# Patient Record
Sex: Female | Born: 1970 | Race: Black or African American | Hispanic: No | Marital: Single | Smoking: Never smoker
Health system: Southern US, Community
[De-identification: ages and names within clinical notes are randomized; demographics above are authoritative.]

## PROBLEM LIST (undated history)

## (undated) DIAGNOSIS — D219 Benign neoplasm of connective and other soft tissue, unspecified: Secondary | ICD-10-CM

## (undated) DIAGNOSIS — D649 Anemia, unspecified: Secondary | ICD-10-CM

## (undated) HISTORY — DX: Anemia, unspecified: D64.9

## (undated) HISTORY — PX: ABDOMINAL HYSTERECTOMY: SHX81

## (undated) HISTORY — PX: FOOT SURGERY: SHX648

## (undated) HISTORY — DX: Benign neoplasm of connective and other soft tissue, unspecified: D21.9

## (undated) HISTORY — PX: TUBAL LIGATION: SHX77

---

## 2013-04-28 ENCOUNTER — Ambulatory Visit: Payer: Self-pay | Admitting: Family Medicine

## 2014-12-30 ENCOUNTER — Encounter: Payer: Self-pay | Admitting: Family Medicine

## 2017-01-07 ENCOUNTER — Other Ambulatory Visit: Payer: Self-pay | Admitting: Medical Oncology

## 2017-01-07 DIAGNOSIS — Z1231 Encounter for screening mammogram for malignant neoplasm of breast: Secondary | ICD-10-CM

## 2017-02-04 ENCOUNTER — Encounter: Payer: Self-pay | Admitting: Radiology

## 2017-02-04 ENCOUNTER — Ambulatory Visit
Admission: RE | Admit: 2017-02-04 | Discharge: 2017-02-04 | Disposition: A | Discharge status: Home / Self Care | Payer: 59 | Source: Ambulatory Visit | Attending: Medical Oncology | Admitting: Medical Oncology

## 2017-02-04 DIAGNOSIS — Z1231 Encounter for screening mammogram for malignant neoplasm of breast: Secondary | ICD-10-CM | POA: Insufficient documentation

## 2018-01-08 ENCOUNTER — Other Ambulatory Visit: Payer: Self-pay | Admitting: Family Medicine

## 2018-01-08 DIAGNOSIS — Z1231 Encounter for screening mammogram for malignant neoplasm of breast: Secondary | ICD-10-CM

## 2018-02-05 ENCOUNTER — Ambulatory Visit
Admission: RE | Admit: 2018-02-05 | Discharge: 2018-02-05 | Disposition: A | Discharge status: Home / Self Care | Payer: 59 | Source: Ambulatory Visit | Attending: Family Medicine | Admitting: Family Medicine

## 2018-02-05 DIAGNOSIS — Z1231 Encounter for screening mammogram for malignant neoplasm of breast: Secondary | ICD-10-CM | POA: Diagnosis not present

## 2018-08-28 DIAGNOSIS — Z6841 Body Mass Index (BMI) 40.0 and over, adult: Secondary | ICD-10-CM | POA: Insufficient documentation

## 2018-09-28 ENCOUNTER — Other Ambulatory Visit: Payer: Self-pay | Admitting: Oncology

## 2018-09-28 DIAGNOSIS — D509 Iron deficiency anemia, unspecified: Secondary | ICD-10-CM | POA: Insufficient documentation

## 2018-09-29 ENCOUNTER — Other Ambulatory Visit: Payer: Self-pay

## 2018-09-30 ENCOUNTER — Inpatient Hospital Stay: Payer: 59 | Attending: Oncology | Admitting: Oncology

## 2018-09-30 ENCOUNTER — Encounter: Payer: Self-pay | Admitting: Oncology

## 2018-09-30 ENCOUNTER — Other Ambulatory Visit: Payer: Self-pay

## 2018-09-30 VITALS — BP 130/91 | HR 94 | Temp 95.9°F | Resp 18 | Ht 64.0 in | Wt 231.1 lb

## 2018-09-30 DIAGNOSIS — D509 Iron deficiency anemia, unspecified: Secondary | ICD-10-CM

## 2018-09-30 DIAGNOSIS — D219 Benign neoplasm of connective and other soft tissue, unspecified: Secondary | ICD-10-CM | POA: Diagnosis not present

## 2018-09-30 NOTE — Progress Notes (Signed)
Pt has been anemic all her life- only took iron pills and she would only take them 2 days before she would get nauseated and would stop taking them. She feels fine she states. She has good energy and sleeps well every night

## 2018-09-30 NOTE — Progress Notes (Signed)
Hematology/Oncology Consult note [facility] Telephone:([phone] [phone]  Patient Care Team: Minna Antis as PCP - General (Physician Assistant)   Name of the patient: Tracey Landry  [id number]  [birth year]    Reason for referral-iron deficiency anemia   Referring physician-Dr. Daryll Drown  Date of visit: 09/30/18   History of presenting illness- Patient is a 48 year old female who sees [name] for pelvic pain and fibroids.  She does have menorrhagia secondary to fibroids and will be undergoing surgery for fibroids in 2 to 3 weeks time.  She has been referred to [place] for iron deficiency anemia.  Most recent CBC from 09/25/2018. Showed white count of 4.9, H&H of 9.9/32.1 with an MCV of 76.4 iron study showed a low ferritin of 4 and increased TIBC of 498.  CMP was within normal limits.  Patient reports iron deficiency anemia for most of her life.  She has been taking oral iron tablets which have not been helping her and it also causes abdominal discomfort and therefore she stopped taking it.  She does have significant fibroids and will be undergoing hysterectomy in 3 weeks.  However she states that these fibroids mainly cause pain and pelvic discomfort but they have not caused menorrhagia.  Her cycles are regular and they last for about 3 to 4 days.  Denies any family history of colon cancer.  Denies any unintentional weight loss.  Denies any blood in stool or urine.  Denies any dark melanotic stool.  Denies any consistent use of NSAIDs.  ECOG PS- 0  Pain scale- 0   Review of systems- Review of Systems  Constitutional: Negative for chills, fever, malaise/fatigue and weight loss.  HENT: Negative for congestion, ear discharge and nosebleeds.   Eyes: Negative for blurred vision.  Respiratory: Negative for cough, hemoptysis, sputum production, shortness of breath and wheezing.   Cardiovascular: Negative for chest pain, palpitations,  orthopnea and claudication.  Gastrointestinal: Negative for abdominal pain, blood in stool, constipation, diarrhea, heartburn, melena, nausea and vomiting.  Genitourinary: Negative for dysuria, flank pain, frequency, hematuria and urgency.  Musculoskeletal: Negative for back pain, joint pain and myalgias.  Skin: Negative for rash.  Neurological: Negative for dizziness, tingling, focal weakness, seizures, weakness and headaches.  Endo/Heme/Allergies: Does not bruise/bleed easily.  Psychiatric/Behavioral: Negative for depression and suicidal ideas. The patient does not have insomnia.     No Known Allergies  Patient Active Problem List   Diagnosis Date Noted  . Iron deficiency anemia 09/28/2018     Past Medical History:  Diagnosis Date  . Anemia   . Leiomyoma      Past surgical history: Tubal ligation and foot surgery  Social History   Socioeconomic History  . Marital status: Single    Spouse name: Not on file  . Number of children: Not on file  . Years of education: Not on file  . Highest education level: Not on file  Occupational History  . Not on file  Social Needs  . Financial resource strain: Not on file  . Food insecurity    Worry: Not on file    Inability: Not on file  . Transportation needs    Medical: Not on file    Non-medical: Not on file  Tobacco Use  . Smoking status: Never Smoker  . Smokeless tobacco: Never Used  Substance and Sexual Activity  . Alcohol use: Never    Frequency: Never  . Drug use: Never  . Sexual activity: Yes  Lifestyle  .  Physical activity    Days per week: Not on file    Minutes per session: Not on file  . Stress: Not on file  Relationships  . Social Herbalist on phone: Not on file    Gets together: Not on file    Attends religious service: Not on file    Active member of club or organization: Not on file    Attends meetings of clubs or organizations: Not on file    Relationship status: Not on file  . Intimate  partner violence    Fear of current or ex partner: Not on file    Emotionally abused: Not on file    Physically abused: Not on file    Forced sexual activity: Not on file  Other Topics Concern  . Not on file  Social History Narrative  . Not on file     Family History  Problem Relation Age of Onset  . Breast cancer Neg Hx     No current outpatient medications on file.   Physical exam:  Vitals:   09/30/18 0916  BP: (!) 130/91  Pulse: 94  Resp: 18  Temp: (!) 95.9 F (35.5 C)  TempSrc: Tympanic  Weight: 231 lb 1.6 oz (104.8 kg)  Height: 5\' 4"  (1.626 m)   Physical Exam HENT:     Head: Normocephalic and atraumatic.  Eyes:     Pupils: Pupils are equal, round, and reactive to light.  Neck:     Musculoskeletal: Normal range of motion.  Cardiovascular:     Rate and Rhythm: Normal rate and regular rhythm.     Heart sounds: Normal heart sounds.  Pulmonary:     Effort: Pulmonary effort is normal.     Breath sounds: Normal breath sounds.  Abdominal:     General: Bowel sounds are normal.     Palpations: Abdomen is soft.     Comments: Palpable intra-abdominal masses consistent with fibroids  Skin:    General: Skin is warm and dry.  Neurological:     Mental Status: She is alert and oriented to person, place, and time.        Assessment and plan- Patient is a 48 y.o. female referred for iron deficiency anemia   Although patient has massive fibroids and will be undergoing a hysterectomy in 3 weeks, patient does not report any menorrhagia secondary to her fibroids.  It is therefore unclear if her iron deficiency anemia is to be due to her fibroids.  II will refer her to gastroenterology for further work-up.  Recent labs are indicative of iron deficiency anemia.  She has upcoming surgery 3 weeks time.  I will therefore proceed with Feraheme 510 mg IV weekly x2 doses.  Discussed risks and benefits of Feraheme including all but not limited to headache, neck swelling and possible  risk of infusion reaction.  Patient understands and agrees to proceed as planned.  I will see her back in 2 months time with repeat CBC ferritin and iron studies, B12, folate, TSH and reticulocyte count.  I will check urinalysis today   Thank you for this kind referral and the opportunity to participate in the care of this patient   Visit Diagnosis 1. Iron deficiency anemia, unspecified iron deficiency anemia type     Dr. Randa Evens, MD, MPH [facility] [phone] 09/30/2018  9:56 AM

## 2018-10-01 ENCOUNTER — Other Ambulatory Visit: Payer: Self-pay

## 2018-10-02 ENCOUNTER — Other Ambulatory Visit: Payer: Self-pay

## 2018-10-02 ENCOUNTER — Inpatient Hospital Stay: Payer: 59

## 2018-10-02 VITALS — BP 123/77 | HR 88 | Temp 96.1°F | Resp 18

## 2018-10-02 DIAGNOSIS — D509 Iron deficiency anemia, unspecified: Secondary | ICD-10-CM | POA: Diagnosis not present

## 2018-10-02 LAB — URINALYSIS, COMPLETE (UACMP) WITH MICROSCOPIC
Bacteria, UA: NONE SEEN
Bilirubin Urine: NEGATIVE
Glucose, UA: NEGATIVE mg/dL
Hgb urine dipstick: NEGATIVE
Ketones, ur: NEGATIVE mg/dL
Leukocytes,Ua: NEGATIVE
Nitrite: NEGATIVE
Protein, ur: NEGATIVE mg/dL
Specific Gravity, Urine: 1.008 (ref 1.005–1.030)
WBC, UA: NONE SEEN WBC/hpf (ref 0–5)
pH: 7 (ref 5.0–8.0)

## 2018-10-02 MED ORDER — SODIUM CHLORIDE 0.9 % IV SOLN
510.0000 mg | Freq: Once | INTRAVENOUS | Status: AC
  Administered 2018-10-02: 510 mg via INTRAVENOUS
  Filled 2018-10-02: qty 17

## 2018-10-02 MED ORDER — SODIUM CHLORIDE 0.9 % IV SOLN
Freq: Once | INTRAVENOUS | Status: AC
  Administered 2018-10-02: 14:00:00 via INTRAVENOUS
  Filled 2018-10-02: qty 250

## 2018-10-02 NOTE — Progress Notes (Signed)
Pt tolerated infusion well. Pt and VS stable at discharge.  

## 2018-10-07 ENCOUNTER — Encounter: Payer: Self-pay | Admitting: *Deleted

## 2018-10-08 ENCOUNTER — Other Ambulatory Visit: Payer: Self-pay

## 2018-10-08 ENCOUNTER — Inpatient Hospital Stay: Payer: 59 | Attending: Oncology

## 2018-10-08 VITALS — BP 149/87 | HR 100 | Resp 20

## 2018-10-08 DIAGNOSIS — D509 Iron deficiency anemia, unspecified: Secondary | ICD-10-CM | POA: Insufficient documentation

## 2018-10-08 MED ORDER — SODIUM CHLORIDE 0.9 % IV SOLN
510.0000 mg | Freq: Once | INTRAVENOUS | Status: AC
  Administered 2018-10-08: 510 mg via INTRAVENOUS
  Filled 2018-10-08: qty 17

## 2018-10-08 MED ORDER — SODIUM CHLORIDE 0.9 % IV SOLN
Freq: Once | INTRAVENOUS | Status: AC
  Administered 2018-10-08: 14:00:00 via INTRAVENOUS
  Filled 2018-10-08: qty 250

## 2018-10-14 ENCOUNTER — Encounter
Admission: RE | Admit: 2018-10-14 | Discharge: 2018-10-14 | Disposition: A | Discharge status: Home / Self Care | Payer: 59 | Source: Ambulatory Visit | Attending: Obstetrics & Gynecology | Admitting: Obstetrics & Gynecology

## 2018-10-14 ENCOUNTER — Other Ambulatory Visit: Payer: Self-pay

## 2018-10-14 NOTE — Patient Instructions (Signed)
Your procedure is scheduled on: 10/19/18 Report to Day Surgery.[facility] SECOND FLOOR To find out your arrival time please call [phone] between 1PM - 3PM on 10/16/18.  Remember: Instructions that are not followed completely may result in serious medical risk,  up to and including death, or upon the discretion of your surgeon and anesthesiologist your  surgery may need to be rescheduled.     _X__ 1. Do not eat food after midnight the night before your procedure.                 No gum chewing or hard candies. You may drink clear liquids up to 2 hours                 before you are scheduled to arrive for your surgery- DO not drink clear                 liquids within 2 hours of the start of your surgery.                 Clear Liquids include:  water, apple juice without pulp, clear carbohydrate                 drink such as Clearfast of Gatorade, Black Coffee or Tea (Do not add                 anything to coffee or tea). FINISH CARB DRINK 3 FULL HOURS BEFORE ARRIVAL AT HOSPITAL AM OF SURGERY  __X__2.  On the morning of surgery brush your teeth with toothpaste and water, you                may rinse your mouth with mouthwash if you wish.  Do not swallow any toothpaste of mouthwash.     _X__ 3.  No Alcohol for 24 hours before or after surgery.   _X__ 4.  Do Not Smoke or use e-cigarettes For 24 Hours Prior to Your Surgery.                 Do not use any chewable tobacco products for at least 6 hours prior to                 surgery.  ____  5.  Bring all medications with you on the day of surgery if instructed.   _X___  6.  Notify your doctor if there is any change in your medical condition      (cold, fever, infections).     Do not wear jewelry, make-up, hairpins, clips or nail polish. Do not wear lotions, powders, or perfumes. You may wear deodorant. Do not shave 48 hours prior to surgery. Men may shave face and neck. Do not bring valuables to the  hospital.    [facility] is not responsible for any belongings or valuables.  Contacts, dentures or bridgework may not be worn into surgery. Leave your suitcase in the car. After surgery it may be brought to your room. For patients admitted to the hospital, discharge time is determined by your treatment team.   Patients discharged the day of surgery will not be allowed to drive home.   Please read over the following fact sheets that you were given:   Surgical Site Infection Prevention          ____ Take these medicines the morning of surgery with A SIP OF WATER:    1. NONE  2.   3.   4.  5.  6.  ____ Fleet Enema (as directed)   _X___ Use CHG Soap as directed  ____ Use inhalers on the day of surgery  ____ Stop metformin 2 days prior to surgery    ____ Take 1/2 of usual insulin dose the night before surgery. No insulin the morning          of surgery.   ____ Stop Coumadin/Plavix/aspirin on   X____ Stop Anti-inflammatories on  DO NOT USE OR START ANY UNTIL AFTER SURGERY   _X___ Stop supplements until after surgery.  DO NOT START ANY UNTIL AFTER SURGERY  ____ Bring C-Pap to the hospital.

## 2018-10-15 ENCOUNTER — Other Ambulatory Visit
Admission: RE | Admit: 2018-10-15 | Discharge: 2018-10-15 | Disposition: A | Discharge status: Home / Self Care | Payer: 59 | Source: Ambulatory Visit | Attending: Obstetrics & Gynecology | Admitting: Obstetrics & Gynecology

## 2018-10-15 DIAGNOSIS — Z01812 Encounter for preprocedural laboratory examination: Secondary | ICD-10-CM | POA: Insufficient documentation

## 2018-10-15 DIAGNOSIS — Z1159 Encounter for screening for other viral diseases: Secondary | ICD-10-CM | POA: Diagnosis not present

## 2018-10-15 LAB — CBC
HCT: 35.7 % — ABNORMAL LOW (ref 36.0–46.0)
Hemoglobin: 11.1 g/dL — ABNORMAL LOW (ref 12.0–15.0)
MCH: 24.8 pg — ABNORMAL LOW (ref 26.0–34.0)
MCHC: 31.1 g/dL (ref 30.0–36.0)
MCV: 79.9 fL — ABNORMAL LOW (ref 80.0–100.0)
Platelets: 304 10*3/uL (ref 150–400)
RBC: 4.47 MIL/uL (ref 3.87–5.11)
RDW: 23.6 % — ABNORMAL HIGH (ref 11.5–15.5)
WBC: 5.2 10*3/uL (ref 4.0–10.5)
nRBC: 0 % (ref 0.0–0.2)

## 2018-10-15 LAB — BASIC METABOLIC PANEL
Anion gap: 8 (ref 5–15)
BUN: 12 mg/dL (ref 6–20)
CO2: 24 mmol/L (ref 22–32)
Calcium: 9.6 mg/dL (ref 8.9–10.3)
Chloride: 105 mmol/L (ref 98–111)
Creatinine, Ser: 0.57 mg/dL (ref 0.44–1.00)
GFR calc Af Amer: >60 mL/min (ref >60)
GFR calc non Af Amer: >60 mL/min (ref >60)
Glucose, Bld: 100 mg/dL — ABNORMAL HIGH (ref 70–99)
Potassium: 4.3 mmol/L (ref 3.5–5.1)
Sodium: 137 mmol/L (ref 135–145)

## 2018-10-15 LAB — TYPE AND SCREEN
ABO/RH(D): O POS
Antibody Screen: NEGATIVE

## 2018-10-16 LAB — SARS CORONAVIRUS 2 (TAT 6-24 HRS): SARS Coronavirus 2: NEGATIVE

## 2018-10-18 MED ORDER — DEXTROSE 5 % IV SOLN
3.0000 g | INTRAVENOUS | Status: AC
  Administered 2018-10-19 (×2): 3 g via INTRAVENOUS
  Filled 2018-10-18: qty 3

## 2018-10-19 ENCOUNTER — Ambulatory Visit: Payer: 59

## 2018-10-19 ENCOUNTER — Other Ambulatory Visit: Payer: Self-pay

## 2018-10-19 ENCOUNTER — Ambulatory Visit: Payer: 59 | Admitting: Certified Registered"

## 2018-10-19 ENCOUNTER — Ambulatory Visit
Admission: RE | Admit: 2018-10-19 | Discharge: 2018-10-19 | Disposition: A | Discharge status: Home / Self Care | Payer: 59 | Attending: Obstetrics & Gynecology | Admitting: Obstetrics & Gynecology

## 2018-10-19 ENCOUNTER — Encounter: Payer: Self-pay | Admitting: *Deleted

## 2018-10-19 ENCOUNTER — Encounter
Admission: RE | Disposition: A | Discharge status: Home / Self Care | Payer: Self-pay | Source: Home / Self Care | Attending: Obstetrics & Gynecology

## 2018-10-19 DIAGNOSIS — Z09 Encounter for follow-up examination after completed treatment for conditions other than malignant neoplasm: Secondary | ICD-10-CM

## 2018-10-19 DIAGNOSIS — N134 Hydroureter: Secondary | ICD-10-CM | POA: Diagnosis not present

## 2018-10-19 DIAGNOSIS — N1330 Unspecified hydronephrosis: Secondary | ICD-10-CM | POA: Diagnosis not present

## 2018-10-19 DIAGNOSIS — D259 Leiomyoma of uterus, unspecified: Secondary | ICD-10-CM | POA: Insufficient documentation

## 2018-10-19 DIAGNOSIS — N800 Endometriosis of uterus: Secondary | ICD-10-CM | POA: Diagnosis not present

## 2018-10-19 HISTORY — PX: ROBOTIC ASSISTED TOTAL HYSTERECTOMY WITH SALPINGECTOMY: SHX6679

## 2018-10-19 HISTORY — PX: ROBOTIC ASSISTED LAPAROSCOPIC LYSIS OF ADHESION: SHX6080

## 2018-10-19 HISTORY — PX: CYSTOSCOPY W/ URETERAL STENT PLACEMENT: SHX1429

## 2018-10-19 LAB — ABO/RH: ABO/RH(D): O POS

## 2018-10-19 LAB — POCT PREGNANCY, URINE: Preg Test, Ur: NEGATIVE

## 2018-10-19 SURGERY — ROBOTIC ASSISTED TOTAL HYSTERECTOMY WITH SALPINGECTOMY
Anesthesia: General | Laterality: Bilateral

## 2018-10-19 MED ORDER — IBUPROFEN 800 MG PO TABS: 800.0000 mg | ORAL_TABLET | Freq: Four times a day (QID) | ORAL | 1 refills | Status: DC

## 2018-10-19 MED ORDER — GABAPENTIN 300 MG PO CAPS
600.0000 mg | ORAL_CAPSULE | ORAL | Status: AC
  Administered 2018-10-19: 600 mg via ORAL

## 2018-10-19 MED ORDER — FAMOTIDINE 20 MG PO TABS
20.0000 mg | ORAL_TABLET | Freq: Once | ORAL | Status: AC
  Administered 2018-10-19: 20 mg via ORAL

## 2018-10-19 MED ORDER — ONDANSETRON HCL 4 MG/2ML IJ SOLN
INTRAMUSCULAR | Status: DC | PRN
  Administered 2018-10-19: 4 mg via INTRAVENOUS

## 2018-10-19 MED ORDER — DEXAMETHASONE SODIUM PHOSPHATE 10 MG/ML IJ SOLN
INTRAMUSCULAR | Status: AC
  Filled 2018-10-19: qty 1

## 2018-10-19 MED ORDER — FENTANYL CITRATE (PF) 100 MCG/2ML IJ SOLN
INTRAMUSCULAR | Status: DC | PRN
  Administered 2018-10-19 (×3): 50 ug via INTRAVENOUS
  Administered 2018-10-19: 100 ug via INTRAVENOUS

## 2018-10-19 MED ORDER — ROCURONIUM BROMIDE 100 MG/10ML IV SOLN
INTRAVENOUS | Status: DC | PRN
  Administered 2018-10-19: 20 mg via INTRAVENOUS
  Administered 2018-10-19: 50 mg via INTRAVENOUS
  Administered 2018-10-19: 10 mg via INTRAVENOUS
  Administered 2018-10-19 (×2): 20 mg via INTRAVENOUS
  Administered 2018-10-19: 30 mg via INTRAVENOUS
  Administered 2018-10-19: 20 mg via INTRAVENOUS
  Administered 2018-10-19: 30 mg via INTRAVENOUS
  Administered 2018-10-19 (×2): 10 mg via INTRAVENOUS

## 2018-10-19 MED ORDER — LIDOCAINE HCL (CARDIAC) PF 100 MG/5ML IV SOSY
PREFILLED_SYRINGE | INTRAVENOUS | Status: DC | PRN
  Administered 2018-10-19: 100 mg via INTRAVENOUS

## 2018-10-19 MED ORDER — LACTATED RINGERS IV SOLN
INTRAVENOUS | Status: DC
  Administered 2018-10-19: 06:00:00 via INTRAVENOUS

## 2018-10-19 MED ORDER — ACETAMINOPHEN 650 MG RE SUPP
650.0000 mg | RECTAL | Status: DC | PRN
  Filled 2018-10-19: qty 1

## 2018-10-19 MED ORDER — KETOROLAC TROMETHAMINE 30 MG/ML IJ SOLN
30.0000 mg | Freq: Four times a day (QID) | INTRAMUSCULAR | Status: DC
  Filled 2018-10-19: qty 1

## 2018-10-19 MED ORDER — FENTANYL CITRATE (PF) 250 MCG/5ML IJ SOLN
INTRAMUSCULAR | Status: AC
  Filled 2018-10-19: qty 5

## 2018-10-19 MED ORDER — MEPERIDINE HCL 50 MG/ML IJ SOLN: 6.2500 mg | INTRAMUSCULAR | Status: DC | PRN

## 2018-10-19 MED ORDER — EPHEDRINE SULFATE 50 MG/ML IJ SOLN
INTRAMUSCULAR | Status: AC
  Filled 2018-10-19: qty 1

## 2018-10-19 MED ORDER — ACETAMINOPHEN 325 MG PO TABS: 650.0000 mg | ORAL_TABLET | ORAL | Status: DC | PRN

## 2018-10-19 MED ORDER — PROMETHAZINE HCL 25 MG/ML IJ SOLN: 6.2500 mg | INTRAMUSCULAR | Status: DC | PRN

## 2018-10-19 MED ORDER — BUPIVACAINE LIPOSOME 1.3 % IJ SUSP
INTRAMUSCULAR | Status: AC
  Filled 2018-10-19: qty 20

## 2018-10-19 MED ORDER — SODIUM CHLORIDE FLUSH 0.9 % IV SOLN
INTRAVENOUS | Status: AC
  Filled 2018-10-19: qty 10

## 2018-10-19 MED ORDER — FAMOTIDINE 20 MG PO TABS
ORAL_TABLET | ORAL | Status: AC
  Administered 2018-10-19: 20 mg via ORAL
  Filled 2018-10-19: qty 1

## 2018-10-19 MED ORDER — OXYCODONE HCL 5 MG PO TABS
5.0000 mg | ORAL_TABLET | Freq: Once | ORAL | Status: AC
  Administered 2018-10-19: 5 mg via ORAL

## 2018-10-19 MED ORDER — DEXAMETHASONE SODIUM PHOSPHATE 10 MG/ML IJ SOLN
4.0000 mg | INTRAMUSCULAR | Status: AC
  Administered 2018-10-19: 4 mg via INTRAVENOUS

## 2018-10-19 MED ORDER — KETOROLAC TROMETHAMINE 30 MG/ML IJ SOLN
INTRAMUSCULAR | Status: DC | PRN
  Administered 2018-10-19: 30 mg via INTRAVENOUS

## 2018-10-19 MED ORDER — HEPARIN SODIUM (PORCINE) 5000 UNIT/ML IJ SOLN
5000.0000 [IU] | INTRAMUSCULAR | Status: AC
  Administered 2018-10-19: 5000 [IU] via SUBCUTANEOUS

## 2018-10-19 MED ORDER — MIDAZOLAM HCL 2 MG/2ML IJ SOLN
INTRAMUSCULAR | Status: DC | PRN
  Administered 2018-10-19: 2 mg via INTRAVENOUS

## 2018-10-19 MED ORDER — LABETALOL HCL 5 MG/ML IV SOLN
INTRAVENOUS | Status: DC | PRN
  Administered 2018-10-19: 5 mg via INTRAVENOUS

## 2018-10-19 MED ORDER — LABETALOL HCL 5 MG/ML IV SOLN
INTRAVENOUS | Status: AC
  Filled 2018-10-19: qty 4

## 2018-10-19 MED ORDER — DEXAMETHASONE SODIUM PHOSPHATE 10 MG/ML IJ SOLN
INTRAMUSCULAR | Status: DC | PRN
  Administered 2018-10-19: 5 mg via INTRAVENOUS

## 2018-10-19 MED ORDER — PHENYLEPHRINE HCL (PRESSORS) 10 MG/ML IV SOLN
INTRAVENOUS | Status: DC | PRN
  Administered 2018-10-19 (×4): 100 ug via INTRAVENOUS

## 2018-10-19 MED ORDER — ONDANSETRON HCL 4 MG/2ML IJ SOLN
INTRAMUSCULAR | Status: AC
  Filled 2018-10-19: qty 2

## 2018-10-19 MED ORDER — PROPOFOL 10 MG/ML IV BOLUS
INTRAVENOUS | Status: DC | PRN
  Administered 2018-10-19: 180 mg via INTRAVENOUS

## 2018-10-19 MED ORDER — BUPIVACAINE LIPOSOME 1.3 % IJ SUSP
INTRAMUSCULAR | Status: DC | PRN
  Administered 2018-10-19: 20 mL

## 2018-10-19 MED ORDER — SUCCINYLCHOLINE CHLORIDE 20 MG/ML IJ SOLN
INTRAMUSCULAR | Status: AC
  Filled 2018-10-19: qty 1

## 2018-10-19 MED ORDER — SCOPOLAMINE 1 MG/3DAYS TD PT72
1.0000 | MEDICATED_PATCH | TRANSDERMAL | Status: DC
  Administered 2018-10-19: 1.5 mg via TRANSDERMAL

## 2018-10-19 MED ORDER — BUPIVACAINE HCL (PF) 0.5 % IJ SOLN
INTRAMUSCULAR | Status: AC
  Filled 2018-10-19: qty 30

## 2018-10-19 MED ORDER — PHENYLEPHRINE HCL (PRESSORS) 10 MG/ML IV SOLN
INTRAVENOUS | Status: AC
  Filled 2018-10-19: qty 1

## 2018-10-19 MED ORDER — DEXAMETHASONE SODIUM PHOSPHATE 10 MG/ML IJ SOLN
INTRAMUSCULAR | Status: AC
  Administered 2018-10-19: 4 mg via INTRAVENOUS
  Filled 2018-10-19: qty 1

## 2018-10-19 MED ORDER — ACETAMINOPHEN 500 MG PO TABS
1000.0000 mg | ORAL_TABLET | ORAL | Status: AC
  Administered 2018-10-19: 1000 mg via ORAL

## 2018-10-19 MED ORDER — OXYCODONE HCL 5 MG/5ML PO SOLN: 5.0000 mg | Freq: Once | ORAL | Status: DC | PRN

## 2018-10-19 MED ORDER — OXYBUTYNIN CHLORIDE 5 MG PO TABS: 5.0000 mg | ORAL_TABLET | Freq: Three times a day (TID) | ORAL | 1 refills | Status: DC

## 2018-10-19 MED ORDER — PROPOFOL 10 MG/ML IV BOLUS
INTRAVENOUS | Status: AC
  Filled 2018-10-19: qty 40

## 2018-10-19 MED ORDER — OXYCODONE HCL 5 MG PO TABS
ORAL_TABLET | ORAL | Status: AC
  Filled 2018-10-19: qty 1

## 2018-10-19 MED ORDER — ROCURONIUM BROMIDE 50 MG/5ML IV SOLN
INTRAVENOUS | Status: AC
  Filled 2018-10-19: qty 1

## 2018-10-19 MED ORDER — LIDOCAINE HCL (PF) 2 % IJ SOLN
INTRAMUSCULAR | Status: AC
  Filled 2018-10-19: qty 10

## 2018-10-19 MED ORDER — MIDAZOLAM HCL 2 MG/2ML IJ SOLN
INTRAMUSCULAR | Status: AC
  Filled 2018-10-19: qty 2

## 2018-10-19 MED ORDER — ENSURE PRE-SURGERY PO LIQD
296.0000 mL | Freq: Once | ORAL | Status: DC
  Filled 2018-10-19: qty 296

## 2018-10-19 MED ORDER — CELECOXIB 200 MG PO CAPS
400.0000 mg | ORAL_CAPSULE | ORAL | Status: AC
  Administered 2018-10-19: 400 mg via ORAL

## 2018-10-19 MED ORDER — ACETAMINOPHEN 500 MG PO TABS
ORAL_TABLET | ORAL | Status: AC
  Administered 2018-10-19: 1000 mg via ORAL
  Filled 2018-10-19: qty 2

## 2018-10-19 MED ORDER — OXYCODONE HCL 5 MG PO TABS: 5.0000 mg | ORAL_TABLET | ORAL | 0 refills | Status: DC | PRN

## 2018-10-19 MED ORDER — BUPIVACAINE HCL (PF) 0.5 % IJ SOLN
INTRAMUSCULAR | Status: DC | PRN
  Administered 2018-10-19: 30 mL

## 2018-10-19 MED ORDER — LACTATED RINGERS IV SOLN: INTRAVENOUS | Status: DC

## 2018-10-19 MED ORDER — SUGAMMADEX SODIUM 200 MG/2ML IV SOLN
INTRAVENOUS | Status: AC
  Filled 2018-10-19: qty 2

## 2018-10-19 MED ORDER — HEPARIN SODIUM (PORCINE) 5000 UNIT/ML IJ SOLN
INTRAMUSCULAR | Status: AC
  Administered 2018-10-19: 5000 [IU] via SUBCUTANEOUS
  Filled 2018-10-19: qty 1

## 2018-10-19 MED ORDER — OXYCODONE HCL 5 MG PO TABS: 5.0000 mg | ORAL_TABLET | Freq: Once | ORAL | Status: DC | PRN

## 2018-10-19 MED ORDER — MORPHINE SULFATE (PF) 4 MG/ML IV SOLN: 1.0000 mg | INTRAVENOUS | Status: DC | PRN

## 2018-10-19 MED ORDER — GABAPENTIN 300 MG PO CAPS
ORAL_CAPSULE | ORAL | Status: AC
  Administered 2018-10-19: 600 mg via ORAL
  Filled 2018-10-19: qty 2

## 2018-10-19 MED ORDER — FENTANYL CITRATE (PF) 100 MCG/2ML IJ SOLN: 25.0000 ug | INTRAMUSCULAR | Status: DC | PRN

## 2018-10-19 MED ORDER — SCOPOLAMINE 1 MG/3DAYS TD PT72
MEDICATED_PATCH | TRANSDERMAL | Status: AC
  Administered 2018-10-19: 1.5 mg via TRANSDERMAL
  Filled 2018-10-19: qty 1

## 2018-10-19 MED ORDER — CELECOXIB 200 MG PO CAPS
ORAL_CAPSULE | ORAL | Status: AC
  Administered 2018-10-19: 400 mg via ORAL
  Filled 2018-10-19: qty 2

## 2018-10-19 MED ORDER — LACTATED RINGERS IV SOLN
INTRAVENOUS | Status: DC | PRN
  Administered 2018-10-19 (×3): via INTRAVENOUS

## 2018-10-19 SURGICAL SUPPLY — 87 items
ANCHOR TIS RET SYS 1550ML (BAG) ×4 IMPLANT
ANCHOR TIS RET SYS 235ML (MISCELLANEOUS) ×4 IMPLANT
BAG URINE DRAINAGE (UROLOGICAL SUPPLIES) ×4 IMPLANT
BLADE SURG SZ11 CARB STEEL (BLADE) ×12 IMPLANT
CANISTER SUCT 1200ML W/VALVE (MISCELLANEOUS) ×4 IMPLANT
CATH FOLEY 2WAY  5CC 16FR (CATHETERS) ×2
CATH URETL 5X70 OPEN END (CATHETERS) ×4 IMPLANT
CATH URTH 16FR FL 2W BLN LF (CATHETERS) ×2 IMPLANT
CHLORAPREP W/TINT 26 (MISCELLANEOUS) ×4 IMPLANT
CORD BIP STRL DISP 12FT (MISCELLANEOUS) ×4 IMPLANT
COVER TIP SHEARS 8 DVNC (MISCELLANEOUS) ×2 IMPLANT
COVER TIP SHEARS 8MM DA VINCI (MISCELLANEOUS) ×2
COVER WAND RF STERILE (DRAPES) ×4 IMPLANT
CRADLE LAMINECT ARM (MISCELLANEOUS) ×4 IMPLANT
DEFOGGER SCOPE WARMER CLEARIFY (MISCELLANEOUS) ×4 IMPLANT
DERMABOND ADVANCED (GAUZE/BANDAGES/DRESSINGS) ×2
DERMABOND ADVANCED .7 DNX12 (GAUZE/BANDAGES/DRESSINGS) ×2 IMPLANT
DRAPE ARM DVNC X/XI (DISPOSABLE) ×8 IMPLANT
DRAPE COLUMN DVNC XI (DISPOSABLE) ×2 IMPLANT
DRAPE DA VINCI XI ARM (DISPOSABLE) ×8
DRAPE DA VINCI XI COLUMN (DISPOSABLE) ×2
DRAPE LEGGINS SURG 28X43 STRL (DRAPES) ×4 IMPLANT
DRAPE SHEET LG 3/4 BI-LAMINATE (DRAPES) ×12 IMPLANT
DRAPE UNDER BUTTOCK W/FLU (DRAPES) ×4 IMPLANT
DRIVER NDL 23- D MEGA 49.7X1.4 (INSTRUMENTS) ×2 IMPLANT
DRVR NDL 23- D MEGA 49.7X1.4 (INSTRUMENTS) ×2
ELECT REM PT RETURN 9FT ADLT (ELECTROSURGICAL) ×4
ELECTRODE REM PT RTRN 9FT ADLT (ELECTROSURGICAL) ×2 IMPLANT
FILTER LAP SMOKE EVAC STRL (MISCELLANEOUS) IMPLANT
GAUZE 4X4 16PLY RFD (DISPOSABLE) ×4 IMPLANT
GLOVE SURG SYN 6.5 ES PF (GLOVE) ×32 IMPLANT
GOWN STRL REUS W/ TWL LRG LVL3 (GOWN DISPOSABLE) ×16 IMPLANT
GOWN STRL REUS W/TWL LRG LVL3 (GOWN DISPOSABLE) ×16
GRASPER SUT TROCAR 14GX15 (MISCELLANEOUS) ×8 IMPLANT
GUIDEWIRE STR DUAL SENSOR (WIRE) ×4 IMPLANT
IRRIGATION STRYKERFLOW (MISCELLANEOUS) ×2 IMPLANT
IRRIGATOR STRYKERFLOW (MISCELLANEOUS) ×4
IV NS 1000ML (IV SOLUTION) ×2
IV NS 1000ML BAXH (IV SOLUTION) ×2 IMPLANT
KIT PINK PAD W/HEAD ARE REST (MISCELLANEOUS) ×4
KIT PINK PAD W/HEAD ARM REST (MISCELLANEOUS) ×2 IMPLANT
KIT TURNOVER CYSTO (KITS) ×4 IMPLANT
L-HOOK LAP DISP 36CM (ELECTROSURGICAL) ×4
LABEL OR SOLS (LABEL) ×4 IMPLANT
LHOOK LAP DISP 36CM (ELECTROSURGICAL) ×2 IMPLANT
MANIPULATOR VCARE LG CRV RETR (MISCELLANEOUS) IMPLANT
MANIPULATOR VCARE SML CRV RETR (MISCELLANEOUS) IMPLANT
MANIPULATOR VCARE STD CRV RETR (MISCELLANEOUS) ×4 IMPLANT
NDL INSUFF 14G 150MM VS150000 (NEEDLE) ×4 IMPLANT
NDL SAFETY 22GX1.5 (NEEDLE) ×4 IMPLANT
NEEDLE HYPO 22GX1.5 SAFETY (NEEDLE) ×4 IMPLANT
NEEDLE VERESS 14GA 120MM (NEEDLE) IMPLANT
NS IRRIG 1000ML POUR BTL (IV SOLUTION) ×4 IMPLANT
OBTURATOR OPTICAL STANDARD 8MM (TROCAR) ×2
OBTURATOR OPTICAL STND 8 DVNC (TROCAR) ×2
OBTURATOR OPTICALSTD 8 DVNC (TROCAR) ×2 IMPLANT
PACK LAP CHOLECYSTECTOMY (MISCELLANEOUS) ×4 IMPLANT
PAD OB MATERNITY 4.3X12.25 (PERSONAL CARE ITEMS) ×4 IMPLANT
PAD PREP 24X41 OB/GYN DISP (PERSONAL CARE ITEMS) ×4 IMPLANT
PENCIL ELECTRO HAND CTR (MISCELLANEOUS) ×4 IMPLANT
SEAL CANN UNIV 5-8 DVNC XI (MISCELLANEOUS) ×2 IMPLANT
SEAL XI 5MM-8MM UNIVERSAL (MISCELLANEOUS) ×2
SEALER VESSEL DA VINCI XI (MISCELLANEOUS) ×2
SEALER VESSEL EXT DVNC XI (MISCELLANEOUS) ×2 IMPLANT
SET CYSTO W/LG BORE CLAMP LF (SET/KITS/TRAYS/PACK) IMPLANT
SOLUTION ELECTROLUBE (MISCELLANEOUS) ×4 IMPLANT
SPONGE LAP 18X18 RF (DISPOSABLE) ×4 IMPLANT
STENT URET 6FRX24 CONTOUR (STENTS) ×4 IMPLANT
SUT CUT NEEDLE DRIVER (INSTRUMENTS) ×2
SUT DVC VLOC 180 0 12IN GS21 (SUTURE) ×4
SUT ETHIBOND NAB CT1 #1 30IN (SUTURE) ×4 IMPLANT
SUT MNCRL 4-0 (SUTURE)
SUT MNCRL 4-0 27XMFL (SUTURE)
SUT VIC AB 0 CT1 36 (SUTURE) IMPLANT
SUT VIC AB 0 UR5 27 (SUTURE) IMPLANT
SUT VIC AB 1 CT1 36 (SUTURE) ×4 IMPLANT
SUT VIC AB 2-0 CT1 27 (SUTURE)
SUT VIC AB 2-0 CT1 TAPERPNT 27 (SUTURE) IMPLANT
SUT VIC AB 3-0 SH 27 (SUTURE) ×4
SUT VIC AB 3-0 SH 27X BRD (SUTURE) ×4 IMPLANT
SUT VICRYL 0 AB UR-6 (SUTURE) ×4 IMPLANT
SUTURE DVC VLC 180 0 12IN GS21 (SUTURE) ×2 IMPLANT
SUTURE MNCRL 4-0 27XMF (SUTURE) IMPLANT
SYR 10ML LL (SYRINGE) ×4 IMPLANT
TROCAR BALLN GELPORT 12X130M (ENDOMECHANICALS) ×4 IMPLANT
TROCAR ENDO BLADELESS 11MM (ENDOMECHANICALS) ×4 IMPLANT
TUBING EVAC SMOKE HEATED PNEUM (TUBING) ×4 IMPLANT

## 2018-10-19 NOTE — Anesthesia Preprocedure Evaluation (Signed)
Anesthesia Evaluation  Patient identified by MRN, date of birth, ID band Patient awake    Reviewed: Allergy & Precautions, NPO status , Patient's Chart, lab work & pertinent test results  History of Anesthesia Complications Negative for: history of anesthetic complications  Airway Mallampati: III       Dental no notable dental hx.    Pulmonary neg pulmonary ROS, neg sleep apnea, neg COPD,    breath sounds clear to auscultation- rhonchi (-) wheezing      Cardiovascular Exercise Tolerance: Good (-) hypertension(-) CAD and (-) Past MI  Rhythm:Regular Rate:Normal - Systolic murmurs and - Diastolic murmurs    Neuro/Psych negative neurological ROS  negative psych ROS   GI/Hepatic negative GI ROS, Neg liver ROS,   Endo/Other  negative endocrine ROSneg diabetes  Renal/GU negative Renal ROS     Musculoskeletal negative musculoskeletal ROS (+)   Abdominal (+) + obese,   Peds  Hematology  (+) anemia ,   Anesthesia Other Findings   Reproductive/Obstetrics                             Anesthesia Physical Anesthesia Plan  ASA: II  Anesthesia Plan: General   Post-op Pain Management:    Induction: Intravenous  PONV Risk Score and Plan: 2 and Ondansetron, Dexamethasone and Midazolam  Airway Management Planned: Oral ETT  Additional Equipment:   Intra-op Plan:   Post-operative Plan: Extubation in OR  Informed Consent: I have reviewed the patients History and Physical, chart, labs and discussed the procedure including the risks, benefits and alternatives for the proposed anesthesia with the patient or authorized representative who has indicated his/her understanding and acceptance.     Dental advisory given  Plan Discussed with: CRNA and Anesthesiologist  Anesthesia Plan Comments:         Anesthesia Quick Evaluation

## 2018-10-19 NOTE — Progress Notes (Signed)
Patient's son, Weber Cooks, called to check on patient. I spoke with her son and provided update as patient is in PACU at this time. He understands [name] will call him with a full update. Weber Cooks states he is appreciative of the information.

## 2018-10-19 NOTE — Op Note (Addendum)
10/19/2018  PATIENT:  Tracey Landry  48 y.o. female  PRE-OPERATIVE DIAGNOSIS:  fibroid uterus, pelvic pain  POST-OPERATIVE DIAGNOSIS:  Same, possible ureteral injury,   PROCEDURE:  Procedure(s): ROBOTIC ASSISTED LAPAROSCOPIC  HYSTERECTOMY WITH BILATERAL SALPINGECTOMY,with hand morcellation (Bilateral) CYSTOSCOPY WITH RETROGRADE PYELOGRAM RIGHT STENT PLACEMENT XI ROBOTIC ASSISTED LAPAROSCOPIC LYSIS OF ADHESION & RIGHT URETEROLYSIS  SURGEON:  Surgeon(s) and Role: Panel 1:    * Wai Minotti, Honor Loh, MD - Primary    * Benjaman Kindler, MD - Assisting Panel 2:    * Bernardo Heater, Ronda Fairly, MD - Primary  ANESTHESIA: GET  EBL:  Total I/O In: 2000 [I.V.:2000] Out: 300 [Blood:300]  DRAINS: foley to gravity, right JJ ureteral stent  SPECIMEN: Uterus, Cervix, bilateral tubes  DISPOSITION OF SPECIMEN:  To pathology  COUNTS: correct x2  COMPLICATIONS: none apparent  PATIENT DISPOSITION:  VS stable to PACU  FINDINGS: 1. Enlarged fibroid uterus with adhesions to right pelvic sidewall, posterior peritoneum, and wide and deep to the right.   2. Possible crush injury vs cautery injury to right ureter noted when right uterus was separated from pelvic side wall. 3. Normal appearing previously ligated tubes and normal appearing ovaries.  This was a 7-hour surgery, due to patient's markedly enlarged uterus, minimally invasive nature, and complex surgical field.     Indication for surgery: Patient had presented with complaints of pelvic pain and large fibroid uterus.  Various treatment options were discussed and patient requested a hysterectomy to resolve her symptoms. Due to the size of her uterus, a robotic approach was preferred.  Risks benefits and alternatives were reviewed and informed consent was obtained.   Procedure: The patient was brought to the OR and identified as Tracey Landry.  She was given general anesthesia via endotracheal route.  Nasogastric tube was placed.  She was then  positioned in the dorsal lithotomy position and prepped and draped in the usual sterile fashion.  A surgical time-out was called. A foley catheter was placed.  A speculum was placed in the vagina and the cervix was visualized, grasped with a single tooth tenaculum and the V-Care uterine manipulator was placed in and around the cervix.  After a change of gloves, the attention was turned to the abdomen.  A midline incision was made approximately 4cm above the umbilicus.  The subcutaneous tissues were dissected, the fascia was divided, the peritoneum entered, and a balloon trochar was inserted.  Pneumoperitoneum was created to 95mmHg.  Three Robotic trochars and one 105mm assist trochar were inserted atraumatically under visualization.  The patient was placed in steep trendelenburg, and the daVinci robot was docked and a monopolar scissors, bipolar fenestrated grasper, and vessel sealer were employed.  A brief survey of the upper abdomen was performed.  The attention was turned to the pelvis. The uterus took up most of the space. The fimbriated ends of the bilateral tubes were ligated and removed from the abdomen. The right side of the uterus was adherent to the right pelvic sidewall.  The right peritoneum was divided, and the uterus was isolated retroperitoneally from the sidewall.  The ureter was seen near the IP.  The right uteroovarian ligament and vessels were cauterized and transected.  The mesosalpinx of the right tube was ligated and divided, and the anterior and posterior broad ligaments were undermined and divided close to the uterine body.  It was at this time the concern for the uretuer and how lateral the uterus was shaped.  Ureterolysis was performed from the  pelvic brim to the bladder, and exposed a kink near the uterine artery.  The artery was cauterized distal to the ureter, and divided at the site of insertion into the uterus. The attention was turned to the left, which was not adherent, but due to  the shape of the fibroids, was difficult to navigate.  The mesosalpinx was sealed and divided; the utero=ovarian ligament was sealed and divided; and the broad ligament was hugged against the uterus and divided down to the cervix.  The bladder flap was created and the vesicouterine peritoneum was dissected off of the anterior LUS and cervix.  The left uterine artery was sealed and divided.  Bilateral ureters were again identified.  The cervix was transected and the uterus brought up into the abdomen.  There was suspicion that the endocervix and external cervical os were included in the specimen, so a figure of eight of 0-vicryl was placed after the instruments were changed to needle drivers.  The uterus was attempted to be placed in a bag, but was too large.  The robot was undocked after surgical sites were deemed hemostatic.  A 5cm suprapubic Pfannenstiel incision was made, and the uterus was grasped.  Using the ExCITE technique, the uterus, tubes, and fibroids were removed from the abdominal cavity.  The suprapubic incision was closed with 0-vicryl and then injected with exparel with marcaine.  The subcutaneous tissues were irrigated.  The fascia of the 72mm ports were both closed with the inlet-closure device.  The skin of all incision were closed with 4-0 monocryl and covered with surgical glue.  The procedure was then deemed complete.  Due to the concern for ureteral injury, Dr. Bernardo Heater was asked to place a stent in the right ureter.  Please see his op note for this portion of the procedure.    The sponge, needle, and instrument counts were correct x2.  The patient tolerated reversal of anesthesia, and was brought to the PACU in a stable condition.  I was present and performed this case in its entirety. Larey Days, MD Attending Obstetrician and Gynecologist [facility]

## 2018-10-19 NOTE — Anesthesia Post-op Follow-up Note (Signed)
Anesthesia QCDR form completed.        

## 2018-10-19 NOTE — H&P (Signed)
Preoperative History and Physical  Tracey Landry is a 48 y.o. here for surgical management of fibroid uterus.  No significant preoperative concerns.  Proposed surgery: robotic assisted laparoscopic hysterectomy, bilateral salpingectomy, with hand morcellation, possible open.  Past Medical History:  Diagnosis Date  . Anemia    IRON INFUSIONS  . Leiomyoma    Past Surgical History:  Procedure Laterality Date  . FOOT SURGERY    . TUBAL LIGATION     OB History  No obstetric history on file.  Patient denies any other pertinent gynecologic issues.   No current facility-administered medications on file prior to encounter.    No current outpatient medications on file prior to encounter.   No Known Allergies  Social History:   reports that she has never smoked. She has never used smokeless tobacco. She reports that she does not drink alcohol or use drugs.  Family History  Problem Relation Age of Onset  . Hypertension Mother   . Hypertension Brother   . Breast cancer Neg Hx     Review of Systems: Noncontributory  PHYSICAL EXAM: Blood pressure (!) 160/92, pulse (!) 102, temperature 97.8 F (36.6 C), temperature source Oral, resp. rate 20, height 5\' 4"  (1.626 m), weight 105.7 kg, last menstrual period 10/14/2018, SpO2 99 %. General appearance - alert, well appearing, and in no distress Chest - clear to auscultation, no wheezes, rales or rhonchi, symmetric air entry Heart - normal rate and regular rhythm Abdomen - soft, nontender, nondistended, no masses or organomegaly Pelvic - examination not indicated Extremities - peripheral pulses normal, no pedal edema, no clubbing or cyanosis  Labs: Results for orders placed or performed during the hospital encounter of 10/19/18 (from the past 336 hour(s))  Pregnancy, urine POC   Collection Time: 10/19/18  6:03 AM  Result Value Ref Range   Preg Test, Ur NEGATIVE NEGATIVE  ABO/Rh   Collection Time: 10/19/18  6:34 AM  Result Value  Ref Range   ABO/RH(D)      O POS Performed at [facility], [address] [place], [place] [address]   Results for orders placed or performed during the hospital encounter of 10/15/18 (from the past 336 hour(s))  SARS Coronavirus 2 (Performed in [facility] hospital lab)   Collection Time: 10/15/18 10:32 AM   Specimen: Nasal Swab  Result Value Ref Range   SARS Coronavirus 2 NEGATIVE NEGATIVE  Basic metabolic panel   Collection Time: 10/15/18 10:32 AM  Result Value Ref Range   Sodium 137 135 - 145 mmol/L   Potassium 4.3 3.5 - 5.1 mmol/L   Chloride 105 98 - 111 mmol/L   CO2 24 22 - 32 mmol/L   Glucose, Bld 100 (H) 70 - 99 mg/dL   BUN 12 6 - 20 mg/dL   Creatinine, Ser 0.57 0.44 - 1.00 mg/dL   Calcium 9.6 8.9 - 10.3 mg/dL   GFR calc non Af Amer >60 >60 mL/min   GFR calc Af Amer >60 >60 mL/min   Anion gap 8 5 - 15  CBC   Collection Time: 10/15/18 10:32 AM  Result Value Ref Range   WBC 5.2 4.0 - 10.5 K/uL   RBC 4.47 3.87 - 5.11 MIL/uL   Hemoglobin 11.1 (L) 12.0 - 15.0 g/dL   HCT 35.7 (L) 36.0 - 46.0 %   MCV 79.9 (L) 80.0 - 100.0 fL   MCH 24.8 (L) 26.0 - 34.0 pg   MCHC 31.1 30.0 - 36.0 g/dL   RDW 23.6 (H)  11.5 - 15.5 %   Platelets 304 150 - 400 K/uL   nRBC 0.0 0.0 - 0.2 %  Type and screen [place]   Collection Time: 10/15/18 10:32 AM  Result Value Ref Range   ABO/RH(D) O POS    Antibody Screen NEG    Sample Expiration 10/29/2018,2359    Extend sample reason      NO TRANSFUSIONS OR PREGNANCY IN THE PAST 3 MONTHS Performed at [facility], [address] [place], [place] [address]     Imaging Studies: No results found.  Assessment: Patient Active Problem List   Diagnosis Date Noted  . Iron deficiency anemia 09/28/2018    Plan: Patient will undergo surgical management with robotic assisted laparoscopic hysterectomy and bilateral salpingectomy with hand morcellation, possibly open.   The risks of surgery were discussed in  detail with the patient including but not limited to: bleeding which may require transfusion or reoperation; infection which may require antibiotics; injury to surrounding organs which may involve bowel, bladder, ureters ; need for additional procedures including laparoscopy or laparotomy; thromboembolic phenomenon, surgical site problems and other postoperative/anesthesia complications. Likelihood of success in alleviating the patient's condition was discussed. Routine postoperative instructions will be reviewed with the patient and her family in detail after surgery.  The patient concurred with the proposed plan, giving informed written consent for the surgery.  Patient has been NPO since last night she will remain NPO for procedure.  Anesthesia and OR aware.  Preoperative prophylactic antibiotics and SCDs ordered on call to the OR.  To OR when ready.  ----- Larey Days, MD, [place] Attending Obstetrician and Gynecologist [facility], Department of [facility]

## 2018-10-19 NOTE — Op Note (Signed)
Preoperative diagnosis:  1. Fibroid uterus  Postoperative diagnosis:  1. Fibroid uterus  Procedure:  1. Cystoscopy 2. Right retrograde pyelography with interpretation 3. Right ureteral stent placement 6FR/24 cm   Surgeon: Nicki Reaper C. Taijon Vink, M.D.  Anesthesia: General  Complications: None  Intraoperative findings:  Right retrograde pyelogram-moderate right hydronephrosis and hydroureter to the junction of the mid/distal ureter.  No extravasation present.  EBL: None  Specimens: None  Indication: Tracey Landry is a 48 y.o. who underwent robotic assisted laparoscopic hysterectomy with bilateral salpingectomy.  She was found to have right ureteral adhesions and underwent ureteral lysis.  Due to the extensive dissection in this area placement of a ureteral stent was requested by [name].  Description of procedure: After completion of her gynecologic procedure she was placed in the lithotomy position.  Her Foley catheter was removed.  A 21 French cystoscope was duplicated and passed per urethra.  Panendoscopy was performed and the bladder mucosa was normal in appearance without erythema, solid or papillary lesions.  The left ureteral orifice was normal appearing with clear efflux.  No efflux was seen from the right ureteral orifice.  A 0.038 sensor wire was placed through the cystoscope and in the right ureteral orifice.  There was mild resistance met in the region of the distal/mid ureter junction however the wire was advanced under fluoroscopic guidance to the right renal pelvis.  After passage of the guidewire brisk efflux of clear urine was noted from the right ureteral orifice.  A 5 French open-ended ureteral catheter was placed over the guidewire to the region of the proximal ureter and the guidewire was removed.  Retrograde pyelogram was then performed with findings as described above.  No contrast was seen distal to the dilated ureter however no extravasation was noted.  The  guidewire was replaced and the ureteral catheter was removed.  A 6 French/24 cm Contour ureteral stent was placed without difficulty.  Good positioning was noted proximally under fluoroscopy and distally under direct vision.  Good efflux of urine was seen through the stent.  The bladder was then emptied and the cystoscope was removed.  After anesthetic reversal she was transported the PACU in stable condition.     Tracey Giovanni, MD

## 2018-10-19 NOTE — Discharge Instructions (Signed)
°  AMBULATORY SURGERY  DISCHARGE INSTRUCTIONS   1) The drugs that you were given will stay in your system until tomorrow so for the next 24 hours you should not:  A) Drive an automobile B) Make any legal decisions C) Drink any alcoholic beverage   2) You may resume regular meals tomorrow.  Today it is better to start with liquids and gradually work up to solid foods.  You may eat anything you prefer, but it is better to start with liquids, then soup and crackers, and gradually work up to solid foods.   3) Please notify your doctor immediately if you have any unusual bleeding, trouble breathing, redness and pain at the surgery site, drainage, fever, or pain not relieved by medication.    4) Additional Instructions:        Please contact your physician with any problems or Same Day Surgery at [phone], Monday through Friday 6 am to 4 pm, or [place] [facility] number at [phone].    Discharge instructions:  Call office if you have any of the following: fever >101 F, chills, shortness of breath, excessive vaginal bleeding, incision drainage or problems, leg pain or redness, or any other concerns.   Activity: Do not lift > 10 lbs for 8 weeks.  No intercourse or tampons for 8 weeks.  No driving for 1-2 weeks.   You may feel some pain in your upper right abdomen/rib and right shoulder.  This is from the gas in the abdomen for surgery. This will subside over time, please be patient!  Take 600mg  Ibuprofen and 1000mg  Tylenol around the clock, every 6 hours for at least the first 3-5 days.  After this you can take as needed.  This will help decrease inflammation and promote healing.  The narcotics you'll take just as needed, as they just trick your brain into thinking its not in pain.    Please don't limit yourself in terms of routine activity.  You will be able to do most things, although they may take longer to do or be a little painful.  You can do it!  Don't be  a hero, but don't be a wimp either!

## 2018-10-19 NOTE — Anesthesia Procedure Notes (Signed)
Procedure Name: Intubation Date/Time: 10/19/2018 8:00 AM Performed by: Chanetta Marshall, CRNA Pre-anesthesia Checklist: Patient identified, Emergency Drugs available, Suction available and Patient being monitored Patient Re-evaluated:Patient Re-evaluated prior to induction Oxygen Delivery Method: Circle system utilized Preoxygenation: Pre-oxygenation with 100% oxygen Induction Type: IV induction Ventilation: Mask ventilation without difficulty Laryngoscope Size: McGraph and 3 Tube type: Oral Tube size: 7.5 mm Number of attempts: 1 Airway Equipment and Method: Stylet and Oral airway Placement Confirmation: ETT inserted through vocal cords under direct vision,  positive ETCO2,  breath sounds checked- equal and bilateral and CO2 detector Secured at: 21 cm Tube secured with: Tape Dental Injury: Teeth and Oropharynx as per pre-operative assessment

## 2018-10-19 NOTE — Anesthesia Postprocedure Evaluation (Signed)
Anesthesia Post Note  Patient: RAJVI ARMENTOR  Procedure(s) Performed: ROBOTIC ASSISTED LAPAROSCOPIC  HYSTERECTOMY WITH BILATERAL SALPINGECTOMY,with hand morcellation (Bilateral ) XI ROBOTIC ASSISTED LAPAROSCOPIC LYSIS OF ADHESION & RIGHT URETEROLYSIS CYSTOSCOPY WITH RETROGRADE PYELOGRAM RIGHT STENT PLACEMENT  Patient location during evaluation: PACU Anesthesia Type: General Level of consciousness: awake and alert Pain management: pain level controlled Vital Signs Assessment: post-procedure vital signs reviewed and stable Respiratory status: spontaneous breathing, nonlabored ventilation, respiratory function stable and patient connected to nasal cannula oxygen Cardiovascular status: blood pressure returned to baseline and stable Postop Assessment: no apparent nausea or vomiting Anesthetic complications: no     Last Vitals:  Vitals:   10/19/18 1652 10/19/18 1731  BP: 118/66 100/76  Pulse: 81 88  Resp: 18 16  Temp: (!) 36.4 C   SpO2: 99% 99%    Last Pain:  Vitals:   10/19/18 1731  TempSrc:   PainSc: 3                  Andrej Spagnoli S

## 2018-10-19 NOTE — Transfer of Care (Signed)
Immediate Anesthesia Transfer of Care Note  Patient: Tracey Landry  Procedure(s) Performed: ROBOTIC ASSISTED LAPAROSCOPIC  HYSTERECTOMY WITH BILATERAL SALPINGECTOMY,with hand morcellation (Bilateral ) XI ROBOTIC ASSISTED LAPAROSCOPIC LYSIS OF ADHESION & RIGHT URETEROLYSIS CYSTOSCOPY WITH RETROGRADE PYELOGRAM RIGHT STENT PLACEMENT  Patient Location: PACU  Anesthesia Type:General  Level of Consciousness: awake, drowsy and patient cooperative  Airway & Oxygen Therapy: Patient Spontanous Breathing and Patient connected to nasal cannula oxygen  Post-op Assessment: Report given to RN and Post -op Vital signs reviewed and stable  Post vital signs: Reviewed and stable  Last Vitals:  Vitals Value Taken Time  BP 115/75 10/19/18 1534  Temp    Pulse 80 10/19/18 1537  Resp 15 10/19/18 1537  SpO2 100 % 10/19/18 1537  Vitals shown include unvalidated device data.  Last Pain:  Vitals:   10/19/18 0601  TempSrc: Oral  PainSc: 0-No pain         Complications: No apparent anesthesia complications

## 2018-10-19 NOTE — OR Nursing (Signed)
Updated pt's son.  Pt still in surgery, per OR nurse - doing well.  Reminded son that Surgeon will call when surgery is finished.  Son stated understanding and had no questions.

## 2018-10-19 NOTE — Progress Notes (Signed)
Pt alert and oriented, VSS, no complaints of pain. Surgical incisions clean, dry, intact. Pt has been recovering in PACU for one hour. Tolerating liquids. States she is ready to go to the restroom and get ready to go home.  Will transport to post op.

## 2018-10-20 ENCOUNTER — Encounter: Payer: Self-pay | Admitting: Obstetrics & Gynecology

## 2018-10-23 LAB — SURGICAL PATHOLOGY

## 2018-10-26 ENCOUNTER — Encounter: Payer: Self-pay | Admitting: Obstetrics & Gynecology

## 2018-11-12 ENCOUNTER — Encounter: Payer: Self-pay | Admitting: Oncology

## 2018-11-17 ENCOUNTER — Other Ambulatory Visit: Payer: Self-pay

## 2018-11-17 ENCOUNTER — Ambulatory Visit (INDEPENDENT_AMBULATORY_CARE_PROVIDER_SITE_OTHER): Payer: 59 | Admitting: Urology

## 2018-11-17 ENCOUNTER — Encounter: Payer: Self-pay | Admitting: Urology

## 2018-11-17 ENCOUNTER — Other Ambulatory Visit: Payer: Self-pay | Admitting: Radiology

## 2018-11-17 VITALS — BP 135/78 | HR 101 | Ht 64.0 in | Wt 219.9 lb

## 2018-11-17 DIAGNOSIS — N135 Crossing vessel and stricture of ureter without hydronephrosis: Secondary | ICD-10-CM

## 2018-11-17 DIAGNOSIS — R102 Pelvic and perineal pain: Secondary | ICD-10-CM | POA: Insufficient documentation

## 2018-11-17 DIAGNOSIS — Z01818 Encounter for other preprocedural examination: Secondary | ICD-10-CM | POA: Diagnosis not present

## 2018-11-17 LAB — URINALYSIS, COMPLETE
Bilirubin, UA: NEGATIVE
Glucose, UA: NEGATIVE
Ketones, UA: NEGATIVE
Nitrite, UA: NEGATIVE
Specific Gravity, UA: 1.015 (ref 1.005–1.030)
Urobilinogen, Ur: 0.2 mg/dL (ref 0.2–1.0)
pH, UA: 6.5 (ref 5.0–7.5)

## 2018-11-17 LAB — MICROSCOPIC EXAMINATION

## 2018-11-17 NOTE — H&P (View-Only) (Signed)
11/17/2018 1:43 PM   Tracey Landry 07-27-1970 [id number]  Referring provider: Nelwyn Salisbury, PA-C [place],  [place] [id number]  Chief Complaint  Patient presents with  . Pre-op Exam    HPI: 48 y.o. female who underwent robotic assisted laparoscopic hysterectomy with bilateral salpingectomy for uterine leiomyomata on 10/19/2018.  There was concern of possible crush versus cautery injury to the right ureter when uterus was separated from the right pelvic sidewall.  Right retrograde pyelogram was performed which showed moderate hydronephrosis/hydroureter to the junction of the mid/distal ureter which was the anatomic area of concern.  A right ureteral stent was placed.  Other than urinary frequency she has no complaints.  She is scheduled for cystoscopy with right ureteral stent removal and retrograde pyelogram on 11/25/2018.  PMH: Past Medical History:  Diagnosis Date  . Anemia    IRON INFUSIONS  . Leiomyoma     Surgical History: Past Surgical History:  Procedure Laterality Date  . CYSTOSCOPY W/ URETERAL STENT PLACEMENT  10/19/2018   Procedure: CYSTOSCOPY WITH RETROGRADE PYELOGRAM RIGHT STENT PLACEMENT;  Surgeon: Abbie Sons, MD;  Location: [facility] ORS;  Service: Urology;;  . FOOT SURGERY    . ROBOTIC ASSISTED LAPAROSCOPIC LYSIS OF ADHESION  10/19/2018   Procedure: XI ROBOTIC ASSISTED LAPAROSCOPIC LYSIS OF ADHESION & RIGHT URETEROLYSIS;  Surgeon: Ward, Honor Loh, MD;  Location: [facility] ORS;  Service: Gynecology;;  . ROBOTIC ASSISTED TOTAL HYSTERECTOMY WITH SALPINGECTOMY Bilateral 10/19/2018   Procedure: ROBOTIC ASSISTED LAPAROSCOPIC  HYSTERECTOMY WITH BILATERAL SALPINGECTOMY,with hand morcellation;  Surgeon: Ward, Honor Loh, MD;  Location: [facility] ORS;  Service: Gynecology;  Laterality: Bilateral;  . TUBAL LIGATION      Home Medications:  Allergies as of 11/17/2018   No Known Allergies     Medication List       Accurate as of November 17, 2018   1:43 PM. If you have any questions, ask your nurse or doctor.        STOP taking these medications   oxyCODONE 5 MG immediate release tablet Commonly known as: Roxicodone Stopped by: Abbie Sons, MD     TAKE these medications   ibuprofen 800 MG tablet Commonly known as: ADVIL Take 1 tablet (800 mg total) by mouth every 6 (six) hours.   oxybutynin 5 MG tablet Commonly known as: DITROPAN Take 1 tablet (5 mg total) by mouth 3 (three) times daily.       Allergies: No Known Allergies  Family History: Family History  Problem Relation Age of Onset  . Hypertension Mother   . Hypertension Brother   . Breast cancer Neg Hx     Social History:  reports that she has never smoked. She has never used smokeless tobacco. She reports that she does not drink alcohol or use drugs.  ROS: UROLOGY Frequent Urination?: No Hard to postpone urination?: No Burning/pain with urination?: No Get up at night to urinate?: No Leakage of urine?: No Urine stream starts and stops?: No Trouble starting stream?: No Do you have to strain to urinate?: No Blood in urine?: No Urinary tract infection?: No Sexually transmitted disease?: No Injury to kidneys or bladder?: No Painful intercourse?: No Weak stream?: No Currently pregnant?: No Vaginal bleeding?: No Last menstrual period?: N/A  Gastrointestinal Nausea?: No Vomiting?: No Indigestion/heartburn?: No Diarrhea?: No Constipation?: No  Constitutional Fever: No Night sweats?: No Weight loss?: No Fatigue?: No  Skin Skin rash/lesions?: No Itching?: No  Eyes Blurred vision?: No Double vision?: No  Ears/Nose/Throat Sore throat?: No Sinus problems?: No  Hematologic/Lymphatic Swollen glands?: No Easy bruising?: No  Cardiovascular Leg swelling?: No Chest pain?: No  Respiratory Cough?: No Shortness of breath?: No  Endocrine Excessive thirst?: No  Musculoskeletal Back pain?: No Joint pain?: No  Neurological  Headaches?: No Dizziness?: No  Psychologic Depression?: No Anxiety?: No  Physical Exam: BP 135/78 (BP Location: Left Arm, Patient Position: Sitting, Cuff Size: Large)   Pulse (!) 101   Ht 5\' 4"  (1.626 m)   Wt 219 lb 14.4 oz (99.7 kg)   LMP 10/19/2018   BMI 37.75 kg/m   Constitutional:  Alert and oriented, No acute distress. HEENT: [place] AT, moist mucus membranes.  Trachea midline, no masses. Cardiovascular: No clubbing, cyanosis, or edema.  RRR Respiratory: Normal respiratory effort, no increased work of breathing.  Clear GI: Abdomen is soft, nontender, nondistended, no abdominal masses GU: No CVA tenderness Lymph: No cervical or inguinal lymphadenopathy. Skin: No rashes, bruises or suspicious lesions. Neurologic: Grossly intact, no focal deficits, moving all 4 extremities. Psychiatric: Normal mood and affect.   Assessment & Plan:    - Obstruction of right ureter She is scheduled for cystoscopy with removal of right ureteral stent and right retrograde pyelogram.  We discussed stent exchange if there was evidence of persistent obstruction.  If there is no obstruction noted will obtain a follow-up renal ultrasound in 2 months.  The procedure was discussed in detail including potential risks of infection, ureteral injury.  She indicated all questions were answered and desires to proceed.   Abbie Sons, [place] [address] [place], [place] [address] [phone]

## 2018-11-17 NOTE — Progress Notes (Signed)
11/17/2018 1:43 PM   Tracey Landry 1970-08-02 [id number]  Referring provider: Nelwyn Salisbury, PA-C [place],  [place] [address]  Chief Complaint  Patient presents with  . Pre-op Exam    HPI: 48 y.o. female who underwent robotic assisted laparoscopic hysterectomy with bilateral salpingectomy for uterine leiomyomata on 10/19/2018.  There was concern of possible crush versus cautery injury to the right ureter when uterus was separated from the right pelvic sidewall.  Right retrograde pyelogram was performed which showed moderate hydronephrosis/hydroureter to the junction of the mid/distal ureter which was the anatomic area of concern.  A right ureteral stent was placed.  Other than urinary frequency she has no complaints.  She is scheduled for cystoscopy with right ureteral stent removal and retrograde pyelogram on 11/25/2018.  PMH: Past Medical History:  Diagnosis Date  . Anemia    IRON INFUSIONS  . Leiomyoma     Surgical History: Past Surgical History:  Procedure Laterality Date  . CYSTOSCOPY W/ URETERAL STENT PLACEMENT  10/19/2018   Procedure: CYSTOSCOPY WITH RETROGRADE PYELOGRAM RIGHT STENT PLACEMENT;  Surgeon: Abbie Sons, MD;  Location: [facility] ORS;  Service: Urology;;  . FOOT SURGERY    . ROBOTIC ASSISTED LAPAROSCOPIC LYSIS OF ADHESION  10/19/2018   Procedure: XI ROBOTIC ASSISTED LAPAROSCOPIC LYSIS OF ADHESION & RIGHT URETEROLYSIS;  Surgeon: Ward, Honor Loh, MD;  Location: [facility] ORS;  Service: Gynecology;;  . ROBOTIC ASSISTED TOTAL HYSTERECTOMY WITH SALPINGECTOMY Bilateral 10/19/2018   Procedure: ROBOTIC ASSISTED LAPAROSCOPIC  HYSTERECTOMY WITH BILATERAL SALPINGECTOMY,with hand morcellation;  Surgeon: Ward, Honor Loh, MD;  Location: [facility] ORS;  Service: Gynecology;  Laterality: Bilateral;  . TUBAL LIGATION      Home Medications:  Allergies as of 11/17/2018   No Known Allergies     Medication List       Accurate as of November 17, 2018   1:43 PM. If you have any questions, ask your nurse or doctor.        STOP taking these medications   oxyCODONE 5 MG immediate release tablet Commonly known as: Roxicodone Stopped by: Abbie Sons, MD     TAKE these medications   ibuprofen 800 MG tablet Commonly known as: ADVIL Take 1 tablet (800 mg total) by mouth every 6 (six) hours.   oxybutynin 5 MG tablet Commonly known as: DITROPAN Take 1 tablet (5 mg total) by mouth 3 (three) times daily.       Allergies: No Known Allergies  Family History: Family History  Problem Relation Age of Onset  . Hypertension Mother   . Hypertension Brother   . Breast cancer Neg Hx     Social History:  reports that she has never smoked. She has never used smokeless tobacco. She reports that she does not drink alcohol or use drugs.  ROS: UROLOGY Frequent Urination?: No Hard to postpone urination?: No Burning/pain with urination?: No Get up at night to urinate?: No Leakage of urine?: No Urine stream starts and stops?: No Trouble starting stream?: No Do you have to strain to urinate?: No Blood in urine?: No Urinary tract infection?: No Sexually transmitted disease?: No Injury to kidneys or bladder?: No Painful intercourse?: No Weak stream?: No Currently pregnant?: No Vaginal bleeding?: No Last menstrual period?: N/A  Gastrointestinal Nausea?: No Vomiting?: No Indigestion/heartburn?: No Diarrhea?: No Constipation?: No  Constitutional Fever: No Night sweats?: No Weight loss?: No Fatigue?: No  Skin Skin rash/lesions?: No Itching?: No  Eyes Blurred vision?: No Double vision?: No  Ears/Nose/Throat Sore throat?: No Sinus problems?: No  Hematologic/Lymphatic Swollen glands?: No Easy bruising?: No  Cardiovascular Leg swelling?: No Chest pain?: No  Respiratory Cough?: No Shortness of breath?: No  Endocrine Excessive thirst?: No  Musculoskeletal Back pain?: No Joint pain?: No  Neurological  Headaches?: No Dizziness?: No  Psychologic Depression?: No Anxiety?: No  Physical Exam: BP 135/78 (BP Location: Left Arm, Patient Position: Sitting, Cuff Size: Large)   Pulse (!) 101   Ht 5\' 4"  (1.626 m)   Wt 219 lb 14.4 oz (99.7 kg)   LMP 10/19/2018   BMI 37.75 kg/m   Constitutional:  Alert and oriented, No acute distress. HEENT: [place] AT, moist mucus membranes.  Trachea midline, no masses. Cardiovascular: No clubbing, cyanosis, or edema.  RRR Respiratory: Normal respiratory effort, no increased work of breathing.  Clear GI: Abdomen is soft, nontender, nondistended, no abdominal masses GU: No CVA tenderness Lymph: No cervical or inguinal lymphadenopathy. Skin: No rashes, bruises or suspicious lesions. Neurologic: Grossly intact, no focal deficits, moving all 4 extremities. Psychiatric: Normal mood and affect.   Assessment & Plan:    - Obstruction of right ureter She is scheduled for cystoscopy with removal of right ureteral stent and right retrograde pyelogram.  We discussed stent exchange if there was evidence of persistent obstruction.  If there is no obstruction noted will obtain a follow-up renal ultrasound in 2 months.  The procedure was discussed in detail including potential risks of infection, ureteral injury.  She indicated all questions were answered and desires to proceed.   Abbie Sons, [place] [address] [place], [place] [address] [phone]

## 2018-11-19 ENCOUNTER — Other Ambulatory Visit: Payer: Self-pay

## 2018-11-19 ENCOUNTER — Encounter
Admission: RE | Admit: 2018-11-19 | Discharge: 2018-11-19 | Disposition: A | Discharge status: Home / Self Care | Payer: 59 | Source: Ambulatory Visit | Attending: Urology | Admitting: Urology

## 2018-11-19 NOTE — Patient Instructions (Signed)
Your procedure is scheduled on: 11/25/18 Report to Day Surgery. [facility]OND FLOOR To find out your arrival time please call [phone] between 1PM - 3PM on 11/24/18.  Remember: Instructions that are not followed completely may result in serious medical risk,  up to and including death, or upon the discretion of your surgeon and anesthesiologist your  surgery may need to be rescheduled.     _X__ 1. Do not eat food after midnight the night before your procedure.                 No gum chewing or hard candies. You may drink clear liquids up to 2 hours                 before you are scheduled to arrive for your surgery- DO not drink clear                 liquids within 2 hours of the start of your surgery.                 Clear Liquids include:  water, apple juice without pulp, clear carbohydrate                 drink such as Clearfast of Gatorade, Black Coffee or Tea (Do not add                 anything to coffee or tea).  __X__2.  On the morning of surgery brush your teeth with toothpaste and water, you                may rinse your mouth with mouthwash if you wish.  Do not swallow any toothpaste of mouthwash.     _X__ 3.  No Alcohol for 24 hours before or after surgery.   _X__ 4.  Do Not Smoke or use e-cigarettes For 24 Hours Prior to Your Surgery.                 Do not use any chewable tobacco products for at least 6 hours prior to                 surgery.  ____  5.  Bring all medications with you on the day of surgery if instructed.   __X__  6.  Notify your doctor if there is any change in your medical condition      (cold, fever, infections).     Do not wear jewelry, make-up, hairpins, clips or nail polish. Do not wear lotions, powders, or perfumes. You may wear deodorant. Do not shave 48 hours prior to surgery. Men may shave face and neck. Do not bring valuables to the hospital.    [facility] is not responsible for any belongings or  valuables.  Contacts, dentures or bridgework may not be worn into surgery. Leave your suitcase in the car. After surgery it may be brought to your room. For patients admitted to the hospital, discharge time is determined by your treatment team.   Patients discharged the day of surgery will not be allowed to drive home.          ____ Take these medicines the morning of surgery with A SIP OF WATER:    1. NONE  2.   3.   4.  5.  6.  ____ Fleet Enema (as directed)   ____ Use CHG Soap as directed  ____ Use inhalers on the day of surgery  ____ Stop metformin  2 days prior to surgery    ____ Take 1/2 of usual insulin dose the night before surgery. No insulin the morning          of surgery.   ____ Stop Coumadin/Plavix/aspirin on   ____ Stop Anti-inflammatories on    ____ Stop supplements until after surgery.    ____ Bring C-Pap to the hospital.

## 2018-11-20 ENCOUNTER — Other Ambulatory Visit
Admission: RE | Admit: 2018-11-20 | Discharge: 2018-11-20 | Disposition: A | Discharge status: Home / Self Care | Payer: 59 | Source: Ambulatory Visit | Attending: Urology | Admitting: Urology

## 2018-11-20 ENCOUNTER — Other Ambulatory Visit: Payer: Self-pay

## 2018-11-20 DIAGNOSIS — Z01812 Encounter for preprocedural laboratory examination: Secondary | ICD-10-CM | POA: Insufficient documentation

## 2018-11-20 DIAGNOSIS — Z20828 Contact with and (suspected) exposure to other viral communicable diseases: Secondary | ICD-10-CM | POA: Diagnosis not present

## 2018-11-20 LAB — CULTURE, URINE COMPREHENSIVE

## 2018-11-20 LAB — SARS CORONAVIRUS 2 (TAT 6-24 HRS): SARS Coronavirus 2: NEGATIVE

## 2018-11-23 ENCOUNTER — Other Ambulatory Visit: Payer: Self-pay

## 2018-11-24 ENCOUNTER — Inpatient Hospital Stay: Payer: 59 | Attending: Oncology

## 2018-11-24 ENCOUNTER — Encounter: Payer: Self-pay | Admitting: Oncology

## 2018-11-24 ENCOUNTER — Other Ambulatory Visit: Payer: Self-pay

## 2018-11-24 ENCOUNTER — Inpatient Hospital Stay (HOSPITAL_BASED_OUTPATIENT_CLINIC_OR_DEPARTMENT_OTHER): Payer: 59 | Admitting: Oncology

## 2018-11-24 VITALS — BP 144/99 | HR 91 | Temp 97.6°F | Resp 16 | Wt 220.5 lb

## 2018-11-24 DIAGNOSIS — D509 Iron deficiency anemia, unspecified: Secondary | ICD-10-CM | POA: Diagnosis not present

## 2018-11-24 DIAGNOSIS — Z90710 Acquired absence of both cervix and uterus: Secondary | ICD-10-CM | POA: Insufficient documentation

## 2018-11-24 DIAGNOSIS — Z803 Family history of malignant neoplasm of breast: Secondary | ICD-10-CM | POA: Insufficient documentation

## 2018-11-24 LAB — CBC WITH DIFFERENTIAL/PLATELET
Abs Immature Granulocytes: 0.01 10*3/uL (ref 0.00–0.07)
Basophils Absolute: 0.1 10*3/uL (ref 0.0–0.1)
Basophils Relative: 1 %
Eosinophils Absolute: 0.2 10*3/uL (ref 0.0–0.5)
Eosinophils Relative: 5 %
HCT: 35.7 % — ABNORMAL LOW (ref 36.0–46.0)
Hemoglobin: 11.6 g/dL — ABNORMAL LOW (ref 12.0–15.0)
Immature Granulocytes: 0 %
Lymphocytes Relative: 46 %
Lymphs Abs: 2.2 10*3/uL (ref 0.7–4.0)
MCH: 26.7 pg (ref 26.0–34.0)
MCHC: 32.5 g/dL (ref 30.0–36.0)
MCV: 82.1 fL (ref 80.0–100.0)
Monocytes Absolute: 0.3 10*3/uL (ref 0.1–1.0)
Monocytes Relative: 6 %
Neutro Abs: 2 10*3/uL (ref 1.7–7.7)
Neutrophils Relative %: 42 %
Platelets: 355 10*3/uL (ref 150–400)
RBC: 4.35 MIL/uL (ref 3.87–5.11)
RDW: 23.1 % — ABNORMAL HIGH (ref 11.5–15.5)
WBC: 4.8 10*3/uL (ref 4.0–10.5)
nRBC: 0 % (ref 0.0–0.2)

## 2018-11-24 LAB — TSH: TSH: 1.201 u[IU]/mL (ref 0.350–4.500)

## 2018-11-24 LAB — RETICULOCYTES
Immature Retic Fract: 12.4 % (ref 2.3–15.9)
RBC.: 4.35 MIL/uL (ref 3.87–5.11)
Retic Count, Absolute: 111.4 10*3/uL (ref 19.0–186.0)
Retic Ct Pct: 2.6 % (ref 0.4–3.1)

## 2018-11-24 LAB — VITAMIN B12: Vitamin B-12: 1694 pg/mL — ABNORMAL HIGH (ref 180–914)

## 2018-11-24 LAB — FOLATE: Folate: 9.4 ng/mL (ref >5.9)

## 2018-11-25 ENCOUNTER — Other Ambulatory Visit: Payer: Self-pay

## 2018-11-25 ENCOUNTER — Encounter
Admission: RE | Disposition: A | Discharge status: Home / Self Care | Payer: Self-pay | Source: Home / Self Care | Attending: Urology

## 2018-11-25 ENCOUNTER — Ambulatory Visit: Payer: 59 | Admitting: Anesthesiology

## 2018-11-25 ENCOUNTER — Encounter: Payer: Self-pay | Admitting: *Deleted

## 2018-11-25 ENCOUNTER — Ambulatory Visit
Admission: RE | Admit: 2018-11-25 | Discharge: 2018-11-25 | Disposition: A | Discharge status: Home / Self Care | Payer: 59 | Attending: Urology | Admitting: Urology

## 2018-11-25 DIAGNOSIS — R350 Frequency of micturition: Secondary | ICD-10-CM | POA: Diagnosis not present

## 2018-11-25 DIAGNOSIS — Z791 Long term (current) use of non-steroidal anti-inflammatories (NSAID): Secondary | ICD-10-CM | POA: Insufficient documentation

## 2018-11-25 DIAGNOSIS — N135 Crossing vessel and stricture of ureter without hydronephrosis: Secondary | ICD-10-CM

## 2018-11-25 DIAGNOSIS — Z90710 Acquired absence of both cervix and uterus: Secondary | ICD-10-CM | POA: Insufficient documentation

## 2018-11-25 DIAGNOSIS — Z79899 Other long term (current) drug therapy: Secondary | ICD-10-CM | POA: Diagnosis not present

## 2018-11-25 DIAGNOSIS — Z960 Presence of urogenital implants: Secondary | ICD-10-CM

## 2018-11-25 HISTORY — PX: CYSTOSCOPY W/ RETROGRADES: SHX1426

## 2018-11-25 HISTORY — PX: CYSTOSCOPY W/ URETERAL STENT REMOVAL: SHX1430

## 2018-11-25 SURGERY — REMOVAL, STENT, URETER, CYSTOSCOPIC
Anesthesia: General | Laterality: Right

## 2018-11-25 MED ORDER — PROPOFOL 10 MG/ML IV BOLUS
INTRAVENOUS | Status: DC | PRN
  Administered 2018-11-25: 200 mg via INTRAVENOUS

## 2018-11-25 MED ORDER — MIDAZOLAM HCL 2 MG/2ML IJ SOLN
INTRAMUSCULAR | Status: AC
  Filled 2018-11-25: qty 2

## 2018-11-25 MED ORDER — DEXAMETHASONE SODIUM PHOSPHATE 10 MG/ML IJ SOLN
INTRAMUSCULAR | Status: AC
  Filled 2018-11-25: qty 1

## 2018-11-25 MED ORDER — ONDANSETRON HCL 4 MG/2ML IJ SOLN: 4.0000 mg | Freq: Once | INTRAMUSCULAR | Status: DC | PRN

## 2018-11-25 MED ORDER — MIDAZOLAM HCL 2 MG/2ML IJ SOLN
INTRAMUSCULAR | Status: DC | PRN
  Administered 2018-11-25: 2 mg via INTRAVENOUS

## 2018-11-25 MED ORDER — FENTANYL CITRATE (PF) 100 MCG/2ML IJ SOLN
INTRAMUSCULAR | Status: AC
  Filled 2018-11-25: qty 2

## 2018-11-25 MED ORDER — FAMOTIDINE 20 MG PO TABS
ORAL_TABLET | ORAL | Status: AC
  Administered 2018-11-25: 20 mg via ORAL
  Filled 2018-11-25: qty 1

## 2018-11-25 MED ORDER — FAMOTIDINE 20 MG PO TABS
20.0000 mg | ORAL_TABLET | Freq: Once | ORAL | Status: AC
  Administered 2018-11-25: 10:00:00 20 mg via ORAL

## 2018-11-25 MED ORDER — LACTATED RINGERS IV SOLN
INTRAVENOUS | Status: DC
  Administered 2018-11-25: 10:00:00 via INTRAVENOUS

## 2018-11-25 MED ORDER — IOPAMIDOL (ISOVUE-200) INJECTION 41%
INTRAVENOUS | Status: DC | PRN
  Administered 2018-11-25: 12:00:00 20 mL via INTRAVENOUS

## 2018-11-25 MED ORDER — DEXMEDETOMIDINE HCL IN NACL 200 MCG/50ML IV SOLN
INTRAVENOUS | Status: DC | PRN
  Administered 2018-11-25: 8 ug via INTRAVENOUS

## 2018-11-25 MED ORDER — GLYCOPYRROLATE 0.2 MG/ML IJ SOLN
INTRAMUSCULAR | Status: AC
  Filled 2018-11-25: qty 1

## 2018-11-25 MED ORDER — FENTANYL CITRATE (PF) 100 MCG/2ML IJ SOLN: 25.0000 ug | INTRAMUSCULAR | Status: DC | PRN

## 2018-11-25 MED ORDER — DEXMEDETOMIDINE HCL IN NACL 80 MCG/20ML IV SOLN
INTRAVENOUS | Status: AC
  Filled 2018-11-25: qty 20

## 2018-11-25 MED ORDER — ONDANSETRON HCL 4 MG/2ML IJ SOLN
INTRAMUSCULAR | Status: DC | PRN
  Administered 2018-11-25: 4 mg via INTRAVENOUS

## 2018-11-25 MED ORDER — LIDOCAINE HCL (CARDIAC) PF 100 MG/5ML IV SOSY
PREFILLED_SYRINGE | INTRAVENOUS | Status: DC | PRN
  Administered 2018-11-25: 100 mg via INTRAVENOUS

## 2018-11-25 MED ORDER — PHENYLEPHRINE HCL (PRESSORS) 10 MG/ML IV SOLN
INTRAVENOUS | Status: DC | PRN
  Administered 2018-11-25 (×3): 100 ug via INTRAVENOUS

## 2018-11-25 MED ORDER — DEXAMETHASONE SODIUM PHOSPHATE 10 MG/ML IJ SOLN
INTRAMUSCULAR | Status: DC | PRN
  Administered 2018-11-25: 10 mg via INTRAVENOUS

## 2018-11-25 MED ORDER — GLYCOPYRROLATE 0.2 MG/ML IJ SOLN
INTRAMUSCULAR | Status: DC | PRN
  Administered 2018-11-25: 0.1 mg via INTRAVENOUS

## 2018-11-25 MED ORDER — LIDOCAINE HCL (PF) 2 % IJ SOLN
INTRAMUSCULAR | Status: AC
  Filled 2018-11-25: qty 10

## 2018-11-25 MED ORDER — ONDANSETRON HCL 4 MG/2ML IJ SOLN
INTRAMUSCULAR | Status: AC
  Filled 2018-11-25: qty 2

## 2018-11-25 MED ORDER — CEFAZOLIN SODIUM-DEXTROSE 2-4 GM/100ML-% IV SOLN
INTRAVENOUS | Status: AC
  Filled 2018-11-25: qty 100

## 2018-11-25 MED ORDER — PROPOFOL 10 MG/ML IV BOLUS
INTRAVENOUS | Status: AC
  Filled 2018-11-25: qty 20

## 2018-11-25 MED ORDER — FENTANYL CITRATE (PF) 100 MCG/2ML IJ SOLN
INTRAMUSCULAR | Status: DC | PRN
  Administered 2018-11-25 (×2): 50 ug via INTRAVENOUS

## 2018-11-25 MED ORDER — CEFAZOLIN SODIUM-DEXTROSE 2-4 GM/100ML-% IV SOLN
2.0000 g | INTRAVENOUS | Status: AC
  Administered 2018-11-25: 2 g via INTRAVENOUS

## 2018-11-25 SURGICAL SUPPLY — 19 items
BAG DRAIN CYSTO-URO LG1000N (MISCELLANEOUS) ×3 IMPLANT
BRUSH SCRUB EZ  4% CHG (MISCELLANEOUS) ×2
BRUSH SCRUB EZ 4% CHG (MISCELLANEOUS) ×1 IMPLANT
CATH URETL 5X70 OPEN END (CATHETERS) ×3 IMPLANT
COVER WAND RF STERILE (DRAPES) ×3 IMPLANT
DRAPE UTILITY 15X26 TOWEL STRL (DRAPES) ×3 IMPLANT
GLOVE BIO SURGEON STRL SZ8 (GLOVE) ×3 IMPLANT
GOWN STRL REUS W/ TWL LRG LVL3 (GOWN DISPOSABLE) ×2 IMPLANT
GOWN STRL REUS W/ TWL XL LVL3 (GOWN DISPOSABLE) ×1 IMPLANT
GOWN STRL REUS W/TWL LRG LVL3 (GOWN DISPOSABLE) ×4
GOWN STRL REUS W/TWL XL LVL3 (GOWN DISPOSABLE) ×5 IMPLANT
GUIDEWIRE STR DUAL SENSOR (WIRE) ×3 IMPLANT
KIT TURNOVER CYSTO (KITS) ×3 IMPLANT
PACK CYSTO AR (MISCELLANEOUS) ×3 IMPLANT
SET CYSTO W/LG BORE CLAMP LF (SET/KITS/TRAYS/PACK) ×3 IMPLANT
SOL .9 NS 3000ML IRR  AL (IV SOLUTION) ×2
SOL .9 NS 3000ML IRR UROMATIC (IV SOLUTION) ×1 IMPLANT
SURGILUBE 2OZ TUBE FLIPTOP (MISCELLANEOUS) ×3 IMPLANT
WATER STERILE IRR 1000ML POUR (IV SOLUTION) ×3 IMPLANT

## 2018-11-25 NOTE — Interval H&P Note (Signed)
History and Physical Interval Note:  11/25/2018 11:37 AM  Tracey Landry  has presented today for surgery, with the diagnosis of right ureteral obstruction.  The various methods of treatment have been discussed with the patient and family. After consideration of risks, benefits and other options for treatment, the patient has consented to  Procedure(s): CYSTOSCOPY WITH STENT REMOVAL (Right) CYSTOSCOPY WITH RETROGRADE PYELOGRAM (Right) as a surgical intervention.  The patient's history has been reviewed, patient examined, no change in status, stable for surgery.  I have reviewed the patient's chart and labs.  Questions were answered to the patient's satisfaction.     [place]

## 2018-11-25 NOTE — Anesthesia Procedure Notes (Signed)
Procedure Name: LMA Insertion Date/Time: 11/25/2018 12:08 PM Performed by: Lavone Orn, CRNA Pre-anesthesia Checklist: Patient identified, Emergency Drugs available, Suction available, Patient being monitored and Timeout performed Patient Re-evaluated:Patient Re-evaluated prior to induction Oxygen Delivery Method: Circle system utilized Preoxygenation: Pre-oxygenation with 100% oxygen Induction Type: IV induction Ventilation: Mask ventilation without difficulty LMA: LMA inserted LMA Size: 4.0 Number of attempts: 1 Placement Confirmation: positive ETCO2 and breath sounds checked- equal and bilateral Tube secured with: Tape Dental Injury: Teeth and Oropharynx as per pre-operative assessment

## 2018-11-25 NOTE — Discharge Instructions (Signed)
AMBULATORY SURGERY  DISCHARGE INSTRUCTIONS   1) The drugs that you were given will stay in your system until tomorrow so for the next 24 hours you should not:  A) Drive an automobile B) Make any legal decisions C) Drink any alcoholic beverage   2) You may resume regular meals tomorrow.  Today it is better to start with liquids and gradually work up to solid foods.  You may eat anything you prefer, but it is better to start with liquids, then soup and crackers, and gradually work up to solid foods.   3) Please notify your doctor immediately if you have any unusual bleeding, trouble breathing, redness and pain at the surgery site, drainage, fever, or pain not relieved by medication.    4) Additional Instructions:   Cystoscopy patient instructions  Following a cystoscopy, a catheter (a flexible rubber tube) is sometimes left in place to empty the bladder. This may cause some discomfort or a feeling that you need to urinate. Your doctor determines the period of time that the catheter will be left in place. You may have bloody urine for two to three days (Call your doctor if the amount of bleeding increases or does not subside).  You may pass blood clots in your urine, especially if you had a biopsy. It is not unusual to pass small blood clots and have some bloody urine a couple of weeks after your cystoscopy. Again, call your doctor if the bleeding does not subside. You may have: Dysuria (painful urination) Frequency (urinating often) Urgency (strong desire to urinate)  These symptoms are common especially if medicine is instilled into the bladder or a ureteral stent is placed. Avoiding alcohol and caffeine, such as coffee, tea, and chocolate, may help relieve these symptoms. Drink plenty of water, unless otherwise instructed. Your doctor may also prescribe an antibiotic or other medicine to reduce these symptoms.  Cystoscopy results are available soon after the procedure; biopsy  results usually take two to four days. Your doctor will discuss the results of your exam with you. Before you go home, you will be given specific instructions for follow-up care. Special Instructions:  1  If you are going home with a catheter in place do not take a tub bath until removed by your doctor.  2  You may resume your normal activities.  3  Do not drive or operate machinery if you are taking narcotic pain medicine.  4  Be sure to keep all follow-up appointments with your doctor.   5 Call Your Doctor If:       You have severe pain  You are unable to urinate  You have a fever over 101  You have severe bleeding              Please contact your physician with any problems or Same Day Surgery at [phone], Monday through Friday 6 am to 4 pm, or [place] at [facility] number at [phone].

## 2018-11-25 NOTE — Op Note (Signed)
Preoperative diagnosis:  1. Right ureteral obstruction status post stent placement  Postoperative diagnosis:  1. Resolution right ureteral obstruction  Procedure: 1. Cystoscopy with removal right ureteral stent 2. Right retrograde pyelogram  Surgeon: Abbie Sons, MD  Anesthesia: General  Complications: None  Intraoperative findings:  Right retrograde pyelogram-resolution of proximal right hydroureter/hydronephrosis.  No narrowing/stricture of the right mid ureter noted.  EBL: None  Specimens: None  Indication: Tracey Landry is a 48 y.o. patient status post robotic assisted laparoscopic hysterectomy on 10/19/2018.  There was concern of possible crush versus cautery injury to the right ureter with separation of the uterus from the pelvic sidewall and retrograde pyelogram showed moderate hydronephrosis/hydroureter and obstruction at the junction of mid/distal ureter.  A ureteral stent was placed without difficulty.   After reviewing the management options for treatment, he elected to proceed with the above surgical procedure(s). We have discussed the potential benefits and risks of the procedure, side effects of the proposed treatment, the likelihood of the patient achieving the goals of the procedure, and any potential problems that might occur during the procedure or recuperation. Informed consent has been obtained.  Description of procedure:  The patient was taken to the operating room and general anesthesia was induced.  The patient was placed in the dorsal lithotomy position, prepped and draped in the usual sterile fashion, and preoperative antibiotics were administered. A preoperative time-out was performed.   A 21 French cystoscope was lubricated and passed per urethra.  Panendoscopy was performed and the bladder mucosa was normal in appearance with exception of mild inflammatory changes secondary to her indwelling ureteral stent.  The ureteral stent was grasped with  endoscopic forceps and brought out to the urethral meatus.  A 0.038 sensor wire was placed through the stent and advanced up to the right renal pelvis under fluoroscopic guidance.  A 5 French open-ended ureteral catheter was then placed over the wire which was removed.    A pullout retrograde pyelogram was performed with findings as described above.  Under real-time fluoroscopy there was complete emptying of contrast from the ureter.  The bladder was emptied and cystoscope removed.  Plan: Schedule follow-up renal ultrasound 6 weeks.   Abbie Sons, M.D.

## 2018-11-25 NOTE — Progress Notes (Signed)
Hematology/Oncology Consult note [facility]  [phone] [phone]  Patient Care Team: Minna Antis as PCP - General (Physician Assistant)   Name of the patient: Tracey Landry  [id number]  1970/08/23   Date of visit: 11/25/18  Diagnosis- iron deficiency anemia  Chief complaint/ Reason for visit- routine f/u of iron deficiency anemia  Heme/Onc history: Patient is a 48 year old female who sees [name] for pelvic pain and fibroids.  She does have menorrhagia secondary to fibroids and will be undergoing surgery for fibroids in 2 to 3 weeks time.  She has been referred to [place] for iron deficiency anemia.  Most recent CBC from 09/25/2018. Showed white count of 4.9, H&H of 9.9/32.1 with an MCV of 76.4 iron study showed a low ferritin of 4 and increased TIBC of 498.  CMP was within normal limits.  Patient reports iron deficiency anemia for most of her life.  She has been taking oral iron tablets which have not been helping her and it also causes abdominal discomfort and therefore she stopped taking it.  She does have significant fibroids and will be undergoing hysterectomy in 3 weeks.  However she states that these fibroids mainly cause pain and pelvic discomfort but they have not caused menorrhagia.  Her cycles are regular and they last for about 3 to 4 days.  Denies any family history of colon cancer.  Denies any unintentional weight loss.  Denies any blood in stool or urine.  Denies any dark melanotic stool.  Denies any consistent use of NSAIDs.  Patient underwent hysterectomy for her fibroids complicated by ureteral injury that was fixed  Interval history- overall she feels well. energy levels are better. Denies any complaints   ECOG PS- 0 Pain scale- 0   Review of systems- Review of Systems  Constitutional: Negative for chills, fever, malaise/fatigue and weight loss.  HENT: Negative for congestion, ear discharge and nosebleeds.    Eyes: Negative for blurred vision.  Respiratory: Negative for cough, hemoptysis, sputum production, shortness of breath and wheezing.   Cardiovascular: Negative for chest pain, palpitations, orthopnea and claudication.  Gastrointestinal: Negative for abdominal pain, blood in stool, constipation, diarrhea, heartburn, melena, nausea and vomiting.  Genitourinary: Negative for dysuria, flank pain, frequency, hematuria and urgency.  Musculoskeletal: Negative for back pain, joint pain and myalgias.  Skin: Negative for rash.  Neurological: Negative for dizziness, tingling, focal weakness, seizures, weakness and headaches.  Endo/Heme/Allergies: Does not bruise/bleed easily.  Psychiatric/Behavioral: Negative for depression and suicidal ideas. The patient does not have insomnia.        No Known Allergies   Past Medical History:  Diagnosis Date  . Anemia    IRON INFUSIONS  . Leiomyoma      Past Surgical History:  Procedure Laterality Date  . CYSTOSCOPY W/ URETERAL STENT PLACEMENT  10/19/2018   Procedure: CYSTOSCOPY WITH RETROGRADE PYELOGRAM RIGHT STENT PLACEMENT;  Surgeon: Abbie Sons, MD;  Location: [facility] ORS;  Service: Urology;;  . FOOT SURGERY    . ROBOTIC ASSISTED LAPAROSCOPIC LYSIS OF ADHESION  10/19/2018   Procedure: XI ROBOTIC ASSISTED LAPAROSCOPIC LYSIS OF ADHESION & RIGHT URETEROLYSIS;  Surgeon: Ward, Honor Loh, MD;  Location: [facility] ORS;  Service: Gynecology;;  . ROBOTIC ASSISTED TOTAL HYSTERECTOMY WITH SALPINGECTOMY Bilateral 10/19/2018   Procedure: ROBOTIC ASSISTED LAPAROSCOPIC  HYSTERECTOMY WITH BILATERAL SALPINGECTOMY,with hand morcellation;  Surgeon: Ward, Honor Loh, MD;  Location: [facility] ORS;  Service: Gynecology;  Laterality: Bilateral;  . TUBAL LIGATION  Social History   Socioeconomic History  . Marital status: Single    Spouse name: Not on file  . Number of children: Not on file  . Years of education: Not on file  . Highest education level: Not on file   Occupational History  . Not on file  Social Needs  . Financial resource strain: Not on file  . Food insecurity    Worry: Not on file    Inability: Not on file  . Transportation needs    Medical: Not on file    Non-medical: Not on file  Tobacco Use  . Smoking status: Never Smoker  . Smokeless tobacco: Never Used  Substance and Sexual Activity  . Alcohol use: Never    Frequency: Never  . Drug use: Never  . Sexual activity: Yes  Lifestyle  . Physical activity    Days per week: Not on file    Minutes per session: Not on file  . Stress: Not on file  Relationships  . Social Herbalist on phone: Not on file    Gets together: Not on file    Attends religious service: Not on file    Active member of club or organization: Not on file    Attends meetings of clubs or organizations: Not on file    Relationship status: Not on file  . Intimate partner violence    Fear of current or ex partner: Not on file    Emotionally abused: Not on file    Physically abused: Not on file    Forced sexual activity: Not on file  Other Topics Concern  . Not on file  Social History Narrative  . Not on file    Family History  Problem Relation Age of Onset  . Hypertension Mother   . Hypertension Brother   . Breast cancer Neg Hx     No current facility-administered medications for this visit.  No current outpatient medications on file.  Facility-Administered Medications Ordered in Other Visits:  .  ceFAZolin (ANCEF) 2-4 GM/100ML-% IVPB, , , ,  .  ceFAZolin (ANCEF) IVPB 2g/100 mL premix, 2 g, Intravenous, 30 min Pre-Op, Stoioff, Scott C, MD .  lactated ringers infusion, , Intravenous, Continuous, Molli Barrows, MD, Last Rate: 75 mL/hr at 11/25/18 1010  Physical exam:  Vitals:   11/24/18 1118  BP: (!) 144/99  Pulse: 91  Resp: 16  Temp: 97.6 F (36.4 C)  Weight: 220 lb 8 oz (100 kg)   Physical Exam HENT:     Head: Normocephalic and atraumatic.  Eyes:     Pupils: Pupils are  equal, round, and reactive to light.  Neck:     Musculoskeletal: Normal range of motion.  Cardiovascular:     Rate and Rhythm: Normal rate and regular rhythm.     Heart sounds: Normal heart sounds.  Pulmonary:     Effort: Pulmonary effort is normal.     Breath sounds: Normal breath sounds.  Abdominal:     General: Bowel sounds are normal.     Palpations: Abdomen is soft.  Skin:    General: Skin is warm and dry.  Neurological:     Mental Status: She is alert and oriented to person, place, and time.      CMP Latest Ref Rng & Units 10/15/2018  Glucose 70 - 99 mg/dL 100(H)  BUN 6 - 20 mg/dL 12  Creatinine 0.44 - 1.00 mg/dL 0.57  Sodium 135 - 145 mmol/L 137  Potassium  3.5 - 5.1 mmol/L 4.3  Chloride 98 - 111 mmol/L 105  CO2 22 - 32 mmol/L 24  Calcium 8.9 - 10.3 mg/dL 9.6   CBC Latest Ref Rng & Units 11/24/2018  WBC 4.0 - 10.5 K/uL 4.8  Hemoglobin 12.0 - 15.0 g/dL 11.6(L)  Hematocrit 36.0 - 46.0 % 35.7(L)  Platelets 150 - 400 K/uL 355    Assessment and plan- Patient is a 48 y.o. female with iron deficiency anemia here for routine f/u  Patients hemoglobin has improved from 9.9 to 11.2. hold off on more IV iron at this time. Repeat cbc ferritin and iron studies in 3 and 6 months and I will see her in 6 months.  If she becomes iron deficient again in the future, she agrees to undergo GI workup at that time.    Visit Diagnosis 1. Iron deficiency anemia, unspecified iron deficiency anemia type      Dr. Randa Evens, MD, MPH [facility] [phone] 11/25/2018 12:10 PM

## 2018-11-25 NOTE — Transfer of Care (Signed)
Immediate Anesthesia Transfer of Care Note  Patient: Tracey Landry  Procedure(s) Performed: CYSTOSCOPY WITH STENT REMOVAL (Right ) CYSTOSCOPY WITH RETROGRADE PYELOGRAM (Right )  Patient Location: PACU  Anesthesia Type:General  Level of Consciousness: awake and drowsy  Airway & Oxygen Therapy: Patient Spontanous Breathing and Patient connected to face mask oxygen  Post-op Assessment: Report given to RN and Post -op Vital signs reviewed and stable  Post vital signs: stable  Last Vitals:  Vitals Value Taken Time  BP 125/80 11/25/18 1239  Temp 36.2 C 11/25/18 1239  Pulse 98 11/25/18 1240  Resp 17 11/25/18 1240  SpO2 100 % 11/25/18 1240  Vitals shown include unvalidated device data.  Last Pain:  Vitals:   11/25/18 1239  TempSrc:   PainSc: 0-No pain         Complications: No apparent anesthesia complications

## 2018-11-25 NOTE — Anesthesia Preprocedure Evaluation (Signed)
Anesthesia Evaluation  Patient identified by MRN, date of birth, ID band Patient awake    Reviewed: Allergy & Precautions, H&P , NPO status , Patient's Chart, lab work & pertinent test results, reviewed documented beta blocker date and time   Airway Mallampati: II  TM Distance: >3 FB Neck ROM: full    Dental  (+) Teeth Intact   Pulmonary neg pulmonary ROS,    Pulmonary exam normal        Cardiovascular Exercise Tolerance: Good negative cardio ROS Normal cardiovascular exam Rate:Normal     Neuro/Psych negative neurological ROS  negative psych ROS   GI/Hepatic negative GI ROS, Neg liver ROS,   Endo/Other  negative endocrine ROS  Renal/GU negative Renal ROS  negative genitourinary   Musculoskeletal   Abdominal   Peds  Hematology  (+) Blood dyscrasia, anemia ,   Anesthesia Other Findings   Reproductive/Obstetrics negative OB ROS                             Anesthesia Physical Anesthesia Plan  ASA: II  Anesthesia Plan: General LMA   Post-op Pain Management:    Induction:   PONV Risk Score and Plan:   Airway Management Planned:   Additional Equipment:   Intra-op Plan:   Post-operative Plan:   Informed Consent: I have reviewed the patients History and Physical, chart, labs and discussed the procedure including the risks, benefits and alternatives for the proposed anesthesia with the patient or authorized representative who has indicated his/her understanding and acceptance.       Plan Discussed with: CRNA  Anesthesia Plan Comments:         Anesthesia Quick Evaluation

## 2018-11-25 NOTE — Anesthesia Post-op Follow-up Note (Signed)
Anesthesia QCDR form completed.        

## 2018-11-25 NOTE — Anesthesia Postprocedure Evaluation (Signed)
Anesthesia Post Note  Patient: Tracey Landry  Procedure(s) Performed: CYSTOSCOPY WITH STENT REMOVAL (Right ) CYSTOSCOPY WITH RETROGRADE PYELOGRAM (Right )  Patient location during evaluation: PACU Anesthesia Type: General Level of consciousness: awake and alert Pain management: pain level controlled Vital Signs Assessment: post-procedure vital signs reviewed and stable Respiratory status: spontaneous breathing, nonlabored ventilation, respiratory function stable and patient connected to nasal cannula oxygen Cardiovascular status: blood pressure returned to baseline and stable Postop Assessment: no apparent nausea or vomiting Anesthetic complications: no     Last Vitals:  Vitals:   11/25/18 1309 11/25/18 1320  BP: (!) 138/98 (!) 145/92  Pulse: 83 89  Resp: 12 18  Temp: (!) 36.4 C 36.6 C  SpO2: 100%     Last Pain:  Vitals:   11/25/18 1320  TempSrc: Oral  PainSc: 0-No pain                 Martha Clan

## 2018-11-29 ENCOUNTER — Other Ambulatory Visit: Payer: Self-pay | Admitting: Urology

## 2018-11-29 DIAGNOSIS — Z87448 Personal history of other diseases of urinary system: Secondary | ICD-10-CM

## 2018-11-30 ENCOUNTER — Telehealth: Payer: Self-pay | Admitting: Urology

## 2018-11-30 ENCOUNTER — Other Ambulatory Visit: Payer: 59

## 2018-11-30 ENCOUNTER — Ambulatory Visit: Payer: 59 | Admitting: Oncology

## 2018-11-30 NOTE — Telephone Encounter (Signed)
App made and mailed to patient ° °Tracey Landry °

## 2018-11-30 NOTE — Telephone Encounter (Signed)
-----   Message from Abbie Sons, MD sent at 11/29/2018 10:20 AM EDT ----- Regarding: Follow-up Needs a follow-up renal ultrasound 6 weeks with a virtual visit for results

## 2018-12-09 ENCOUNTER — Telehealth: Payer: Self-pay | Admitting: Urology

## 2018-12-09 NOTE — Telephone Encounter (Signed)
-----   Message from Abbie Sons, MD sent at 12/08/2018  9:46 PM EDT ----- Regarding: RE: Appointment Okay.  Go ahead and make the 21st ----- Message ----- From: Benard Halsted Sent: 12/08/2018   4:02 PM EDT To: Abbie Sons, MD Subject: RE: Appointment                                His next available is the 21st at 11:30 he is doing another surgery  on the 14th and last time he tried that at lunch we had to cancel his clinic ----- Message ----- From: Abbie Sons, MD Sent: 12/08/2018   3:01 PM EDT To: Benard Halsted Subject: RE: Appointment                                Duh... How about the following Monday? ----- Message ----- From: Benard Halsted Sent: 12/08/2018   2:46 PM EDT To: Abbie Sons, MD Subject: RE: Appointment                                The office is closed Monday for Labor Day ----- Message ----- From: Abbie Sons, MD Sent: 12/08/2018   2:35 PM EDT To: Benard Halsted Subject: Appointment                                    Does Dr. Matilde Sprang have any slots to see this patient next Monday?  Ureteral injury with clear fluid leakage per vagina

## 2018-12-09 NOTE — Telephone Encounter (Signed)
Patient has been scheduled and notified   Tracey Landry

## 2018-12-16 ENCOUNTER — Encounter: Payer: Self-pay | Admitting: Urology

## 2018-12-21 ENCOUNTER — Other Ambulatory Visit: Payer: Self-pay

## 2018-12-21 ENCOUNTER — Ambulatory Visit (INDEPENDENT_AMBULATORY_CARE_PROVIDER_SITE_OTHER): Payer: 59 | Admitting: Urology

## 2018-12-21 ENCOUNTER — Encounter: Payer: Self-pay | Admitting: Urology

## 2018-12-21 VITALS — BP 141/84 | HR 91 | Ht 64.0 in | Wt 213.0 lb

## 2018-12-21 DIAGNOSIS — N135 Crossing vessel and stricture of ureter without hydronephrosis: Secondary | ICD-10-CM

## 2018-12-21 NOTE — Addendum Note (Signed)
Addended by: Tommy Rainwater on: 12/21/2018 09:59 AM   Modules accepted: Orders

## 2018-12-21 NOTE — H&P (View-Only) (Signed)
12/21/2018 9:30 AM   [name] 1970-07-07 [id number]  Referring provider: Nelwyn Salisbury, PA-C [address] [place],  [place] [address]  No chief complaint on file.   HPI: Dr [name]: 48 y.o. female who underwent robotic assisted laparoscopic hysterectomy with bilateral salpingectomy for uterine leiomyomata on 10/19/2018.  There was concern of possible crush versus cautery injury to the right ureter when uterus was separated from the right pelvic sidewall.  Right retrograde pyelogram was performed which showed moderate hydronephrosis/hydroureter to the junction of the mid/distal ureter which was the anatomic area of concern.  A right ureteral stent was placed.   Today Patient had low-grade 19th with no narrowing or stricture of the mid right ureter or hydronephrosis proximal.  Follow-up renal ultrasound was described.  Patient reports leakage without awareness since her hysterectomy.  She wears 4 pads a day that can be quite wet.  She leaks more when she sleeping.  She leaks all the time.  She thinks it might be from the vagina.  Further questioning the patient says she leaked since the surgery but then after the stent was placed she got dry for about 1 week and then it started again after the stent was removed.  No flank pain  She does void spontaneously on her own every few hours.  She can sit for 2 hours but still be leaking and then urinate.  Flow was good.  Patient denies history of kidney stones previous to surgery.  No neurologic issues.  Bowel movements normal  Modifying factors: There are no other modifying factors  Associated signs and symptoms: There are no other associated signs and symptoms Aggravating and relieving factors: There are no other aggravating or relieving factors Severity: Moderate Duration: Persistent     PMH: Past Medical History:  Diagnosis Date  . Anemia    IRON INFUSIONS  . Leiomyoma     Surgical History: Past Surgical  History:  Procedure Laterality Date  . CYSTOSCOPY W/ RETROGRADES Right 11/25/2018   Procedure: CYSTOSCOPY WITH RETROGRADE PYELOGRAM;  Surgeon: Abbie Sons, MD;  Location: [facility];  Service: Urology;  Laterality: Right;  . CYSTOSCOPY W/ URETERAL STENT PLACEMENT  10/19/2018   Procedure: CYSTOSCOPY WITH RETROGRADE PYELOGRAM RIGHT STENT PLACEMENT;  Surgeon: Abbie Sons, MD;  Location: [facility];  Service: Urology;;  . [name] W/ URETERAL STENT REMOVAL Right 11/25/2018   Procedure: CYSTOSCOPY WITH STENT REMOVAL;  Surgeon: Abbie Sons, MD;  Location: [facility];  Service: Urology;  Laterality: Right;  . FOOT SURGERY    . ROBOTIC ASSISTED LAPAROSCOPIC LYSIS OF ADHESION  10/19/2018   Procedure: XI ROBOTIC ASSISTED LAPAROSCOPIC LYSIS OF ADHESION & RIGHT URETEROLYSIS;  Surgeon: Ward, Honor Loh, MD;  Location: [facility];  Service: Gynecology;;  . ROBOTIC ASSISTED TOTAL HYSTERECTOMY WITH SALPINGECTOMY Bilateral 10/19/2018   Procedure: ROBOTIC ASSISTED LAPAROSCOPIC  HYSTERECTOMY WITH BILATERAL SALPINGECTOMY,with hand morcellation;  Surgeon: Ward, Honor Loh, MD;  Location: [facility];  Service: Gynecology;  Laterality: Bilateral;  . TUBAL LIGATION      Home Medications:  Allergies as of 12/21/2018   No Known Allergies     Medication List       Accurate as of December 21, 2018  9:30 AM. If you have any questions, ask your nurse or doctor.        neomycin-bacitracin-polymyxin ointment Commonly known as: NEOSPORIN Apply 1 application topically as needed for wound care.       Allergies: No Known Allergies  Family History: Family  History  Problem Relation Age of Onset  . Hypertension Mother   . Hypertension Brother   . Breast cancer Neg Hx     Social History:  reports that she has never smoked. She has never used smokeless tobacco. She reports that she does not drink alcohol or use drugs.  ROS: UROLOGY Frequent Urination?: No Hard to postpone urination?: No Burning/pain with  urination?: No Get up at night to urinate?: No Leakage of urine?: No Urine stream starts and stops?: No Trouble starting stream?: No Do you have to strain to urinate?: No Blood in urine?: No Urinary tract infection?: No Sexually transmitted disease?: No Injury to kidneys or bladder?: No Painful intercourse?: No Weak stream?: No Currently pregnant?: No Vaginal bleeding?: No Last menstrual period?: n  Gastrointestinal Nausea?: No Vomiting?: No Indigestion/heartburn?: No Diarrhea?: No Constipation?: No  Constitutional Fever: No Night sweats?: No Weight loss?: No Fatigue?: No  Skin Skin rash/lesions?: No Itching?: No  Eyes Blurred vision?: No Double vision?: No  Ears/Nose/Throat Sore throat?: No Sinus problems?: No  Hematologic/Lymphatic Swollen glands?: No Easy bruising?: No  Cardiovascular Leg swelling?: No Chest pain?: No  Respiratory Cough?: No Shortness of breath?: No  Endocrine Excessive thirst?: No  Musculoskeletal Back pain?: No Joint pain?: No  Neurological Headaches?: No Dizziness?: No  Psychologic Depression?: No Anxiety?: No  Physical Exam: BP (!) 141/84   Pulse 91   Ht 5\' 4"  (AB-[id number] m)   Wt 96.6 kg   LMP 10/19/2018   BMI 36.56 kg/m   Constitutional:  Alert and oriented, No acute distress. HEENT:  AT, moist mucus membranes.  Trachea midline, no masses. Cardiovascular: No clubbing, cyanosis, or edema. Respiratory: Normal respiratory effort, no increased work of breathing. GI: Abdomen is soft, nontender, nondistended, no abdominal masses GU: On pelvic examination there appear to be urine in the upper vaginal vault.  It was clear fluid.  She has a very long vagina and reduced access at the level of the introitus.  I could not visualize the top of the cuff with the speculum utilized.  I did not see any granulation tissue or erythema or fistula. Skin: No rashes, bruises or suspicious lesions. Lymph: No cervical or inguinal  adenopathy. Neurologic: Grossly intact, no focal deficits, moving all 4 extremities. Psychiatric: Normal mood and affect.  Laboratory Data: Lab Results  Component Value Date   WBC 4.8 11/24/2018   HGB 11.6 (L) 11/24/2018   HCT 35.7 (L) 11/24/2018   MCV 82.1 11/24/2018   PLT 355 11/24/2018    Lab Results  Component Value Date   CREATININE 0.57 10/15/2018    No results found for: PSA  No results found for: TESTOSTERONE  No results found for: HGBA1C  Urinalysis    Component Value Date/Time   COLORURINE YELLOW (A) 10/02/2018 1350   APPEARANCEUR Cloudy (A) 11/17/2018 1317   LABSPEC 1.008 10/02/2018 1350   PHURINE 7.0 10/02/2018 1350   GLUCOSEU Negative 11/17/2018 1317   HGBUR NEGATIVE 10/02/2018 1350   BILIRUBINUR Negative 11/17/2018 1317   KETONESUR NEGATIVE 10/02/2018 1350   PROTEINUR 1+ (A) 11/17/2018 1317   PROTEINUR NEGATIVE 10/02/2018 1350   NITRITE Negative 11/17/2018 1317   NITRITE NEGATIVE 10/02/2018 1350   LEUKOCYTESUR 1+ (A) 11/17/2018 1317   LEUKOCYTESUR NEGATIVE 10/02/2018 1350    Pertinent Imaging:   Assessment & Plan: Patient reports leakage without awareness since hysterectomy but had a normal cystoscopy and retrograde since then.  Based upon a recent cystoscopy and the history she certainly may have a  fistula between the right ureter and vaginal cuff although the injury appeared to be higher.  I think a CT scan would be beneficial to examine the right kidney right ureter and pelvis.  An examination under anesthesia with cystoscopy and right retrograde would be important.  Bladder capacity would also be measured under anesthesia.  Of vaginal examination would be repeated and her vaginal access I think would be difficult.  She would also be consented for repeat stent.  Picture was drawn.  Pros cons and risk of procedure.  Inability to pass stent discussed.  Potential role of future percutaneous procedure discussed.  Stent in place for multiple weeks as a  treatment discussed.  There are no diagnoses linked to this encounter.  No follow-ups on file.  Reece Packer, MD  [place] [address] [place], [place] [address] [phone]

## 2018-12-21 NOTE — Progress Notes (Signed)
12/21/2018 9:30 AM   Tracey Landry July 06, 1970 [id number]  Referring provider: Nelwyn Salisbury, PA-C [address] [place],  [place] [address]  No chief complaint on file.   HPI: Dr [name]: 48 y.o. female who underwent robotic assisted laparoscopic hysterectomy with bilateral salpingectomy for uterine leiomyomata on 10/19/2018.  There was concern of possible crush versus cautery injury to the right ureter when uterus was separated from the right pelvic sidewall.  Right retrograde pyelogram was performed which showed moderate hydronephrosis/hydroureter to the junction of the mid/distal ureter which was the anatomic area of concern.  A right ureteral stent was placed.   Today Patient had low-grade 19th with no narrowing or stricture of the mid right ureter or hydronephrosis proximal.  Follow-up renal ultrasound was described.  Patient reports leakage without awareness since her hysterectomy.  She wears 4 pads a day that can be quite wet.  She leaks more when she sleeping.  She leaks all the time.  She thinks it might be from the vagina.  Further questioning the patient says she leaked since the surgery but then after the stent was placed she got dry for about 1 week and then it started again after the stent was removed.  No flank pain  She does void spontaneously on her own every few hours.  She can sit for 2 hours but still be leaking and then urinate.  Flow was good.  Patient denies history of kidney stones previous to surgery.  No neurologic issues.  Bowel movements normal  Modifying factors: There are no other modifying factors  Associated signs and symptoms: There are no other associated signs and symptoms Aggravating and relieving factors: There are no other aggravating or relieving factors Severity: Moderate Duration: Persistent     PMH: Past Medical History:  Diagnosis Date  . Anemia    IRON INFUSIONS  . Leiomyoma     Surgical History: Past Surgical  History:  Procedure Laterality Date  . CYSTOSCOPY W/ RETROGRADES Right 11/25/2018   Procedure: CYSTOSCOPY WITH RETROGRADE PYELOGRAM;  Surgeon: Abbie Sons, MD;  Location: [facility];  Service: Urology;  Laterality: Right;  . CYSTOSCOPY W/ URETERAL STENT PLACEMENT  10/19/2018   Procedure: CYSTOSCOPY WITH RETROGRADE PYELOGRAM RIGHT STENT PLACEMENT;  Surgeon: Abbie Sons, MD;  Location: [facility];  Service: Urology;;  . Consuela Landry W/ URETERAL STENT REMOVAL Right 11/25/2018   Procedure: CYSTOSCOPY WITH STENT REMOVAL;  Surgeon: Abbie Sons, MD;  Location: [facility];  Service: Urology;  Laterality: Right;  . FOOT SURGERY    . ROBOTIC ASSISTED LAPAROSCOPIC LYSIS OF ADHESION  10/19/2018   Procedure: XI ROBOTIC ASSISTED LAPAROSCOPIC LYSIS OF ADHESION & RIGHT URETEROLYSIS;  Surgeon: Ward, Honor Loh, MD;  Location: [facility];  Service: Gynecology;;  . ROBOTIC ASSISTED TOTAL HYSTERECTOMY WITH SALPINGECTOMY Bilateral 10/19/2018   Procedure: ROBOTIC ASSISTED LAPAROSCOPIC  HYSTERECTOMY WITH BILATERAL SALPINGECTOMY,with hand morcellation;  Surgeon: Ward, Honor Loh, MD;  Location: [facility];  Service: Gynecology;  Laterality: Bilateral;  . TUBAL LIGATION      Home Medications:  Allergies as of 12/21/2018   No Known Allergies     Medication List       Accurate as of December 21, 2018  9:30 AM. If you have any questions, ask your nurse or doctor.        neomycin-bacitracin-polymyxin ointment Commonly known as: NEOSPORIN Apply 1 application topically as needed for wound care.       Allergies: No Known Allergies  Family History: Family  History  Problem Relation Age of Onset  . Hypertension Mother   . Hypertension Brother   . Breast cancer Neg Hx     Social History:  reports that she has never smoked. She has never used smokeless tobacco. She reports that she does not drink alcohol or use drugs.  ROS: UROLOGY Frequent Urination?: No Hard to postpone urination?: No Burning/pain with  urination?: No Get up at night to urinate?: No Leakage of urine?: No Urine stream starts and stops?: No Trouble starting stream?: No Do you have to strain to urinate?: No Blood in urine?: No Urinary tract infection?: No Sexually transmitted disease?: No Injury to kidneys or bladder?: No Painful intercourse?: No Weak stream?: No Currently pregnant?: No Vaginal bleeding?: No Last menstrual period?: n  Gastrointestinal Nausea?: No Vomiting?: No Indigestion/heartburn?: No Diarrhea?: No Constipation?: No  Constitutional Fever: No Night sweats?: No Weight loss?: No Fatigue?: No  Skin Skin rash/lesions?: No Itching?: No  Eyes Blurred vision?: No Double vision?: No  Ears/Nose/Throat Sore throat?: No Sinus problems?: No  Hematologic/Lymphatic Swollen glands?: No Easy bruising?: No  Cardiovascular Leg swelling?: No Chest pain?: No  Respiratory Cough?: No Shortness of breath?: No  Endocrine Excessive thirst?: No  Musculoskeletal Back pain?: No Joint pain?: No  Neurological Headaches?: No Dizziness?: No  Psychologic Depression?: No Anxiety?: No  Physical Exam: BP (!) 141/84   Pulse 91   Ht 5\' 4"  (1.626 m)   Wt 96.6 kg   LMP 10/19/2018   BMI 36.56 kg/m   Constitutional:  Alert and oriented, No acute distress. HEENT: [place] AT, moist mucus membranes.  Trachea midline, no masses. Cardiovascular: No clubbing, cyanosis, or edema. Respiratory: Normal respiratory effort, no increased work of breathing. GI: Abdomen is soft, nontender, nondistended, no abdominal masses GU: On pelvic examination there appear to be urine in the upper vaginal vault.  It was clear fluid.  She has a very long vagina and reduced access at the level of the introitus.  I could not visualize the top of the cuff with the speculum utilized.  I did not see any granulation tissue or erythema or fistula. Skin: No rashes, bruises or suspicious lesions. Lymph: No cervical or inguinal  adenopathy. Neurologic: Grossly intact, no focal deficits, moving all 4 extremities. Psychiatric: Normal mood and affect.  Laboratory Data: Lab Results  Component Value Date   WBC 4.8 11/24/2018   HGB 11.6 (L) 11/24/2018   HCT 35.7 (L) 11/24/2018   MCV 82.1 11/24/2018   PLT 355 11/24/2018    Lab Results  Component Value Date   CREATININE 0.57 10/15/2018    No results found for: PSA  No results found for: TESTOSTERONE  No results found for: HGBA1C  Urinalysis    Component Value Date/Time   COLORURINE YELLOW (A) 10/02/2018 1350   APPEARANCEUR Cloudy (A) 11/17/2018 1317   LABSPEC 1.008 10/02/2018 1350   PHURINE 7.0 10/02/2018 1350   GLUCOSEU Negative 11/17/2018 1317   HGBUR NEGATIVE 10/02/2018 1350   BILIRUBINUR Negative 11/17/2018 1317   KETONESUR NEGATIVE 10/02/2018 1350   PROTEINUR 1+ (A) 11/17/2018 1317   PROTEINUR NEGATIVE 10/02/2018 1350   NITRITE Negative 11/17/2018 1317   NITRITE NEGATIVE 10/02/2018 1350   LEUKOCYTESUR 1+ (A) 11/17/2018 1317   LEUKOCYTESUR NEGATIVE 10/02/2018 1350    Pertinent Imaging:   Assessment & Plan: Patient reports leakage without awareness since hysterectomy but had a normal cystoscopy and retrograde since then.  Based upon a recent cystoscopy and the history she certainly may have a  fistula between the right ureter and vaginal cuff although the injury appeared to be higher.  I think a CT scan would be beneficial to examine the right kidney right ureter and pelvis.  An examination under anesthesia with cystoscopy and right retrograde would be important.  Bladder capacity would also be measured under anesthesia.  Of vaginal examination would be repeated and her vaginal access I think would be difficult.  She would also be consented for repeat stent.  Picture was drawn.  Pros cons and risk of procedure.  Inability to pass stent discussed.  Potential role of future percutaneous procedure discussed.  Stent in place for multiple weeks as a  treatment discussed.  There are no diagnoses linked to this encounter.  No follow-ups on file.  Reece Packer, MD  [facility] [address] [place], [place] [address] [phone]

## 2018-12-22 ENCOUNTER — Other Ambulatory Visit: Payer: Self-pay

## 2018-12-22 ENCOUNTER — Ambulatory Visit
Admission: RE | Admit: 2018-12-22 | Discharge: 2018-12-22 | Disposition: A | Discharge status: Home / Self Care | Payer: 59 | Source: Ambulatory Visit | Attending: Urology | Admitting: Urology

## 2018-12-22 DIAGNOSIS — N135 Crossing vessel and stricture of ureter without hydronephrosis: Secondary | ICD-10-CM | POA: Insufficient documentation

## 2018-12-22 MED ORDER — IOHEXOL 300 MG/ML  SOLN
125.0000 mL | Freq: Once | INTRAMUSCULAR | Status: AC | PRN
  Administered 2018-12-22: 12:00:00 125 mL via INTRAVENOUS

## 2018-12-24 ENCOUNTER — Other Ambulatory Visit: Payer: Self-pay | Admitting: Obstetrics & Gynecology

## 2018-12-24 ENCOUNTER — Other Ambulatory Visit: Payer: Self-pay

## 2018-12-24 ENCOUNTER — Other Ambulatory Visit
Admission: RE | Admit: 2018-12-24 | Discharge: 2018-12-24 | Disposition: A | Discharge status: Home / Self Care | Payer: 59 | Source: Ambulatory Visit | Attending: Urology | Admitting: Urology

## 2018-12-24 DIAGNOSIS — Z01812 Encounter for preprocedural laboratory examination: Secondary | ICD-10-CM | POA: Insufficient documentation

## 2018-12-24 DIAGNOSIS — Z20828 Contact with and (suspected) exposure to other viral communicable diseases: Secondary | ICD-10-CM | POA: Insufficient documentation

## 2018-12-24 DIAGNOSIS — Z1231 Encounter for screening mammogram for malignant neoplasm of breast: Secondary | ICD-10-CM

## 2018-12-24 LAB — SARS CORONAVIRUS 2 (TAT 6-24 HRS): SARS Coronavirus 2: NEGATIVE

## 2018-12-28 ENCOUNTER — Encounter
Admission: RE | Disposition: A | Discharge status: Home / Self Care | Payer: Self-pay | Source: Home / Self Care | Attending: Urology

## 2018-12-28 ENCOUNTER — Other Ambulatory Visit: Payer: Self-pay

## 2018-12-28 ENCOUNTER — Encounter: Payer: Self-pay | Admitting: *Deleted

## 2018-12-28 ENCOUNTER — Ambulatory Visit: Payer: 59 | Admitting: Urology

## 2018-12-28 ENCOUNTER — Ambulatory Visit
Admission: RE | Admit: 2018-12-28 | Discharge: 2018-12-28 | Disposition: A | Discharge status: Home / Self Care | Payer: 59 | Attending: Urology | Admitting: Urology

## 2018-12-28 ENCOUNTER — Ambulatory Visit: Payer: 59 | Admitting: Anesthesiology

## 2018-12-28 DIAGNOSIS — Z6836 Body mass index (BMI) 36.0-36.9, adult: Secondary | ICD-10-CM | POA: Diagnosis not present

## 2018-12-28 DIAGNOSIS — E669 Obesity, unspecified: Secondary | ICD-10-CM | POA: Diagnosis not present

## 2018-12-28 DIAGNOSIS — Z960 Presence of urogenital implants: Secondary | ICD-10-CM | POA: Diagnosis not present

## 2018-12-28 DIAGNOSIS — N1330 Unspecified hydronephrosis: Secondary | ICD-10-CM | POA: Diagnosis not present

## 2018-12-28 DIAGNOSIS — N9989 Other postprocedural complications and disorders of genitourinary system: Secondary | ICD-10-CM | POA: Insufficient documentation

## 2018-12-28 DIAGNOSIS — Y838 Other surgical procedures as the cause of abnormal reaction of the patient, or of later complication, without mention of misadventure at the time of the procedure: Secondary | ICD-10-CM | POA: Diagnosis not present

## 2018-12-28 DIAGNOSIS — N134 Hydroureter: Secondary | ICD-10-CM | POA: Insufficient documentation

## 2018-12-28 DIAGNOSIS — Z90710 Acquired absence of both cervix and uterus: Secondary | ICD-10-CM | POA: Insufficient documentation

## 2018-12-28 DIAGNOSIS — N821 Other female urinary-genital tract fistulae: Secondary | ICD-10-CM | POA: Diagnosis present

## 2018-12-28 HISTORY — PX: CYSTOSCOPY WITH STENT PLACEMENT: SHX5790

## 2018-12-28 HISTORY — PX: CYSTOSCOPY W/ RETROGRADES: SHX1426

## 2018-12-28 HISTORY — PX: CYSTOGRAM: SHX6285

## 2018-12-28 SURGERY — CYSTOSCOPY, WITH STENT INSERTION
Anesthesia: General | Site: Bladder | Laterality: Right

## 2018-12-28 MED ORDER — ONDANSETRON HCL 4 MG/2ML IJ SOLN
INTRAMUSCULAR | Status: AC
  Filled 2018-12-28: qty 2

## 2018-12-28 MED ORDER — FAMOTIDINE 20 MG PO TABS
20.0000 mg | ORAL_TABLET | Freq: Once | ORAL | Status: AC
  Administered 2018-12-28: 15:00:00 20 mg via ORAL

## 2018-12-28 MED ORDER — PHENYLEPHRINE HCL (PRESSORS) 10 MG/ML IV SOLN
INTRAVENOUS | Status: DC | PRN
  Administered 2018-12-28 (×2): 100 ug via INTRAVENOUS

## 2018-12-28 MED ORDER — MIDAZOLAM HCL 2 MG/2ML IJ SOLN
INTRAMUSCULAR | Status: DC | PRN
  Administered 2018-12-28: 2 mg via INTRAVENOUS

## 2018-12-28 MED ORDER — CEFAZOLIN SODIUM-DEXTROSE 2-3 GM-%(50ML) IV SOLR
INTRAVENOUS | Status: DC | PRN
  Administered 2018-12-28: 2 g via INTRAVENOUS

## 2018-12-28 MED ORDER — LIDOCAINE HCL (CARDIAC) PF 100 MG/5ML IV SOSY
PREFILLED_SYRINGE | INTRAVENOUS | Status: DC | PRN
  Administered 2018-12-28: 100 mg via INTRAVENOUS

## 2018-12-28 MED ORDER — LIDOCAINE HCL (PF) 2 % IJ SOLN
INTRAMUSCULAR | Status: AC
  Filled 2018-12-28: qty 10

## 2018-12-28 MED ORDER — IOHEXOL 180 MG/ML  SOLN
INTRAMUSCULAR | Status: DC | PRN
  Administered 2018-12-28: 160 mL

## 2018-12-28 MED ORDER — FENTANYL CITRATE (PF) 100 MCG/2ML IJ SOLN
INTRAMUSCULAR | Status: DC | PRN
  Administered 2018-12-28 (×2): 25 ug via INTRAVENOUS
  Administered 2018-12-28: 50 ug via INTRAVENOUS

## 2018-12-28 MED ORDER — PROPOFOL 10 MG/ML IV BOLUS
INTRAVENOUS | Status: AC
  Filled 2018-12-28: qty 20

## 2018-12-28 MED ORDER — PROPOFOL 10 MG/ML IV BOLUS
INTRAVENOUS | Status: DC | PRN
  Administered 2018-12-28: 150 mg via INTRAVENOUS
  Administered 2018-12-28: 50 mg via INTRAVENOUS

## 2018-12-28 MED ORDER — ONDANSETRON HCL 4 MG/2ML IJ SOLN
INTRAMUSCULAR | Status: DC | PRN
  Administered 2018-12-28: 4 mg via INTRAVENOUS

## 2018-12-28 MED ORDER — DEXAMETHASONE SODIUM PHOSPHATE 10 MG/ML IJ SOLN
INTRAMUSCULAR | Status: AC
  Filled 2018-12-28: qty 1

## 2018-12-28 MED ORDER — CEFAZOLIN SODIUM-DEXTROSE 2-4 GM/100ML-% IV SOLN
INTRAVENOUS | Status: AC
  Filled 2018-12-28: qty 100

## 2018-12-28 MED ORDER — FAMOTIDINE 20 MG PO TABS
ORAL_TABLET | ORAL | Status: AC
  Administered 2018-12-28: 15:00:00 20 mg via ORAL
  Filled 2018-12-28: qty 1

## 2018-12-28 MED ORDER — LACTATED RINGERS IV SOLN
INTRAVENOUS | Status: DC
  Administered 2018-12-28: 15:00:00 via INTRAVENOUS

## 2018-12-28 MED ORDER — MIDAZOLAM HCL 2 MG/2ML IJ SOLN
INTRAMUSCULAR | Status: AC
  Filled 2018-12-28: qty 2

## 2018-12-28 MED ORDER — FENTANYL CITRATE (PF) 100 MCG/2ML IJ SOLN
INTRAMUSCULAR | Status: AC
  Filled 2018-12-28: qty 2

## 2018-12-28 MED ORDER — DEXAMETHASONE SODIUM PHOSPHATE 10 MG/ML IJ SOLN
INTRAMUSCULAR | Status: DC | PRN
  Administered 2018-12-28: 10 mg via INTRAVENOUS

## 2018-12-28 SURGICAL SUPPLY — 24 items
BAG DRAIN CYSTO-URO LG1000N (MISCELLANEOUS) ×4 IMPLANT
BRUSH SCRUB EZ  4% CHG (MISCELLANEOUS) ×2
BRUSH SCRUB EZ 4% CHG (MISCELLANEOUS) ×2 IMPLANT
CATH ROBINSON RED A/P 16FR (CATHETERS) ×2 IMPLANT
CATH URETL 5X70 OPEN END (CATHETERS) ×4 IMPLANT
DRAPE UTILITY 15X26 TOWEL STRL (DRAPES) ×4 IMPLANT
GLIDEWIRE STIFF .35X180X3 HYDR (WIRE) ×2 IMPLANT
GLOVE BIO SURGEON STRL SZ8 (GLOVE) ×4 IMPLANT
GOWN STRL REUS W/ TWL LRG LVL3 (GOWN DISPOSABLE) ×2 IMPLANT
GOWN STRL REUS W/ TWL XL LVL3 (GOWN DISPOSABLE) ×2 IMPLANT
GOWN STRL REUS W/TWL LRG LVL3 (GOWN DISPOSABLE) ×2
GOWN STRL REUS W/TWL XL LVL3 (GOWN DISPOSABLE) ×6 IMPLANT
GUIDEWIRE STR DUAL SENSOR (WIRE) ×4 IMPLANT
KIT TURNOVER CYSTO (KITS) ×4 IMPLANT
PACK CYSTO AR (MISCELLANEOUS) ×4 IMPLANT
SET CYSTO W/LG BORE CLAMP LF (SET/KITS/TRAYS/PACK) ×4 IMPLANT
SOL .9 NS 3000ML IRR  AL (IV SOLUTION) ×2
SOL .9 NS 3000ML IRR UROMATIC (IV SOLUTION) ×2 IMPLANT
STENT URET 6FRX24 CONTOUR (STENTS) IMPLANT
STENT URET 6FRX26 CONTOUR (STENTS) IMPLANT
SURGILUBE 2OZ TUBE FLIPTOP (MISCELLANEOUS) ×4 IMPLANT
SYR 10ML LL (SYRINGE) ×2 IMPLANT
SYRINGE IRR TOOMEY STRL 70CC (SYRINGE) ×2 IMPLANT
WATER STERILE IRR 1000ML POUR (IV SOLUTION) ×4 IMPLANT

## 2018-12-28 NOTE — Transfer of Care (Signed)
Immediate Anesthesia Transfer of Care Note  Patient: [name]  Procedure(s) Performed: CYSTOSCOPY WITH  EXAM UNDER ANESTHESIA, FAILED STENT (Right ) CYSTOSCOPY WITH RETROGRADE PYELOGRAM (Right ) CYSTOGRAM (Bladder)  Patient Location: PACU  Anesthesia Type:General  Level of Consciousness: awake  Airway & Oxygen Therapy: Patient connected to face mask oxygen  Post-op Assessment: Post -op Vital signs reviewed and stable  Post vital signs: stable  Last Vitals:  Vitals Value Taken Time  BP 126/75 12/28/18 1753  Temp    Pulse 75 12/28/18 1753  Resp 14 12/28/18 1753  SpO2 99 % 12/28/18 1753    Last Pain:  Vitals:   12/28/18 1424  PainSc: 0-No pain         Complications: No apparent anesthesia complications

## 2018-12-28 NOTE — Op Note (Signed)
Preoperative diagnosis:  1. Urinary incontinence 2. 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 Right ureterovaginal fistula  Procedure: 1. Cystoscopy with right retrograde pyelogram 2. Right ureteroscopy-diagnostic 3. Examination under anesthesia 4. Cystogram  Surgeon: Abbie Sons, MD  Assistant surgeon: Bjorn Loser, MD  Anesthesia: General  Complications: None  Intraoperative findings:  1.  Right retrograde pyelogram-normal caliber distal ureter with fistulous tract seen on fluoroscopy into the vagina.  Ureter proximal to the fistulous tract was not visualized.  2.  Right ureteroscopy-normal appearing distal ureter for approximately 4 cm then medial deviation and inflammatory tissue.  3.  Cystogram-bladder capacity under anesthesia 775 mL to gravity fill  EBL: None  Specimens: None  Indication: Tracey Landry is a 48 y.o. patient status post robotic assisted laparoscopic hysterectomy with bilateral salpingectomy on 10/19/2018.  There was possible crush versus cautery injury to the right ureter noted when the right uterus was being separated from the pelvic sidewall.  Intraoperative retrograde pyelogram showed moderate right hydronephrosis and hydroureter to the junction of the mid/distal ureter without extravasation.  A ureteral stent was placed and left indwelling for 6 weeks.  Follow-up cystoscopy under anesthesia with right retrograde pyelogram showed no stricture, obstruction or contrast extravasation.  She began to note leakage of fluid per vagina shortly after stent removal.  After reviewing the management options for treatment, he elected to proceed with the above surgical procedure(s). We have discussed the potential benefits and risks of the procedure, side effects of the proposed treatment, the likelihood of the patient achieving the goals of the procedure, and any potential problems that might occur during the procedure or recuperation.  Informed consent has been obtained.  Description of procedure:  The patient was taken to the operating room and general anesthesia was induced.  The patient was placed in the dorsal lithotomy position, prepped and draped in the usual sterile fashion, and preoperative antibiotics were administered. A preoperative time-out was performed.   A 21 French cystoscope was lubricated and passed per urethra.  Panendoscopy was performed and the bladder mucosa was normal in appearance without solid or papillary lesions.  There was minimal erythema of the posterior wall with no findings suspicious for fistula.  The left ureteral orifice was normal appearing with clear efflux.  The right ureteral orifice was normal in appearance however no efflux was noted.  A 5 French open-ended ureteral catheter was placed through the cystoscope and into the right ureteral orifice.  Retrograde pyelogram was performed with findings as described above.  Attempts at passing a 0.038 Sensor wire and Glidewire through the ureteral catheter were remarkable for medial deviation of the guidewire.  A 4.5 French semirigid ureteroscope was then passed per urethra and advanced as described above.  Retrograde pyelogram was performed at the junction of the normal-appearing ureteral mucosa and the abnormal appearing mucosa and in addition to the previously noted fistulous tract contrast was noted pooling superior to this area.  The ureteroscope was then removed and the bladder was drained.  Vaginal examination under anesthesia was performed by  Dr. Matilde Sprang.  The vaginal length was significant.  Small area of whitish tissue was noted far proximally on the anterior wall.  A 16 French red rubber catheter was then placed in the urethra and contrast was instilled by gravity to equilibrium.  The bladder was smooth-walled on fluoroscopy without evidence of extravasation.  Volume emptied was 775 mL.  The catheter was removed.  After anesthetic  reversal patient was transported to the  PACU in stable condition.  Plan: Since continuity of the right ureter could not be established/identified would recommend placement of a right percutaneous nephrostomy with antegrade ureteropyelogram.  She will most likely need robotic ureteral reimplant.   Abbie Sons, M.D.

## 2018-12-28 NOTE — Discharge Instructions (Signed)
Cystoscopy patient instructions  Following a cystoscopy, a catheter (a flexible rubber tube) is sometimes left in place to empty the bladder. This may cause some discomfort or a feeling that you need to urinate. Your doctor determines the period of time that the catheter will be left in place. You may have bloody urine for two to three days (Call your doctor if the amount of bleeding increases or does not subside).  You may pass blood clots in your urine, especially if you had a biopsy. It is not unusual to pass small blood clots and have some bloody urine a couple of weeks after your cystoscopy. Again, call your doctor if the bleeding does not subside. You may have: Dysuria (painful urination) Frequency (urinating often) Urgency (strong desire to urinate)  These symptoms are common especially if medicine is instilled into the bladder or a ureteral stent is placed. Avoiding alcohol and caffeine, such as coffee, tea, and chocolate, may help relieve these symptoms. Drink plenty of water, unless otherwise instructed. Your doctor may also prescribe an antibiotic or other medicine to reduce these symptoms.  Dr. Bernardo Heater will call you tomorrow about intraoperative findings and the recommended plan.  Special Instructions:  1  If you are going home with a catheter in place do not take a tub bath until removed by your doctor.  2  You may resume your normal activities.  3  Do not drive or operate machinery if you are taking narcotic pain medicine.  4  Be sure to keep all follow-up appointments with your doctor.   5 Call Your Doctor If: The catheter is not draining  You have severe pain  You are unable to urinate  You have a fever over 101  You have severe bleeding

## 2018-12-28 NOTE — Anesthesia Preprocedure Evaluation (Signed)
Anesthesia Evaluation  Patient identified by MRN, date of birth, ID band Patient awake    Reviewed: Allergy & Precautions, NPO status , Patient's Chart, lab work & pertinent test results, reviewed documented beta blocker date and time   Airway Mallampati: III  TM Distance: >3 FB     Dental  (+) Chipped   Pulmonary           Cardiovascular      Neuro/Psych    GI/Hepatic   Endo/Other    Renal/GU      Musculoskeletal   Abdominal   Peds  Hematology  (+) anemia ,   Anesthesia Other Findings Obese.  Reproductive/Obstetrics                             Anesthesia Physical Anesthesia Plan  ASA: III  Anesthesia Plan: General   Post-op Pain Management:    Induction: Intravenous  PONV Risk Score and Plan:   Airway Management Planned: LMA  Additional Equipment:   Intra-op Plan:   Post-operative Plan:   Informed Consent: I have reviewed the patients History and Physical, chart, labs and discussed the procedure including the risks, benefits and alternatives for the proposed anesthesia with the patient or authorized representative who has indicated his/her understanding and acceptance.       Plan Discussed with: CRNA  Anesthesia Plan Comments:         Anesthesia Quick Evaluation

## 2018-12-28 NOTE — Anesthesia Procedure Notes (Signed)
Procedure Name: LMA Insertion Date/Time: 12/28/2018 4:25 PM Performed by: Rona Ravens, CRNA Pre-anesthesia Checklist: Patient identified, Emergency Drugs available, Suction available, Patient being monitored and Timeout performed Patient Re-evaluated:Patient Re-evaluated prior to induction Oxygen Delivery Method: Circle system utilized Preoxygenation: Pre-oxygenation with 100% oxygen Induction Type: IV induction LMA: LMA inserted LMA Size: 5.0 Number of attempts: 2 Tube secured with: Tape Dental Injury: Teeth and Oropharynx as per pre-operative assessment

## 2018-12-28 NOTE — Interval H&P Note (Signed)
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  The various methods of treatment have been discussed with the patient and family. After consideration of risks, benefits and other options for treatment, the patient has consented to  Procedure(s): CYSTOSCOPY WITH STENT PLACEMENT WITH EXAM UNDER ANESTHESIA (Right) CYSTOSCOPY WITH RETROGRADE PYELOGRAM (Right) as a surgical intervention.  The patient's history has been reviewed, patient examined, no change in status, stable for surgery.  I have reviewed the patient's chart and labs.  Questions were answered to the patient's satisfaction.     [place]

## 2018-12-28 NOTE — Anesthesia Post-op Follow-up Note (Signed)
Anesthesia QCDR form completed.        

## 2018-12-29 ENCOUNTER — Encounter: Payer: Self-pay | Admitting: Urology

## 2018-12-29 ENCOUNTER — Telehealth: Payer: Self-pay | Admitting: Urology

## 2018-12-29 ENCOUNTER — Other Ambulatory Visit: Payer: Self-pay | Admitting: Radiology

## 2018-12-29 ENCOUNTER — Telehealth: Payer: Self-pay | Admitting: Radiology

## 2018-12-29 DIAGNOSIS — N821 Other female urinary-genital tract fistulae: Secondary | ICD-10-CM

## 2018-12-29 DIAGNOSIS — N135 Crossing vessel and stricture of ureter without hydronephrosis: Secondary | ICD-10-CM

## 2018-12-29 NOTE — Anesthesia Postprocedure Evaluation (Signed)
Anesthesia Post Note  Patient: Tracey Landry  Procedure(s) Performed: CYSTOSCOPY WITH  EXAM UNDER ANESTHESIA, FAILED STENT (Right ) CYSTOSCOPY WITH RETROGRADE PYELOGRAM (Right ) CYSTOGRAM (Bladder)  Patient location during evaluation: PACU Anesthesia Type: General Level of consciousness: awake and alert Pain management: pain level controlled Vital Signs Assessment: post-procedure vital signs reviewed and stable Respiratory status: spontaneous breathing, nonlabored ventilation and respiratory function stable Cardiovascular status: blood pressure returned to baseline and stable Postop Assessment: no apparent nausea or vomiting Anesthetic complications: no     Last Vitals:  Vitals:   12/28/18 1803 12/28/18 1810  BP: 140/78 (!) 142/78  Pulse:  78  Resp:  18  Temp: (!) 36 C 36.7 C  SpO2:  100%    Last Pain:  Vitals:   12/28/18 1810  TempSrc: Temporal  PainSc: 0-No pain                 Durenda Hurt

## 2018-12-29 NOTE — Telephone Encounter (Signed)
I contacted Ms. Tracey Landry regarding intraoperative findings yesterday and diagnosis of ureterovaginal fistula.  Stent placement was unsuccessful.  Will need to schedule placement of a right percutaneous nephrostomy tube.

## 2018-12-30 ENCOUNTER — Other Ambulatory Visit: Payer: Self-pay | Admitting: Radiology

## 2018-12-30 DIAGNOSIS — N821 Other female urinary-genital tract fistulae: Secondary | ICD-10-CM

## 2018-12-30 DIAGNOSIS — N135 Crossing vessel and stricture of ureter without hydronephrosis: Secondary | ICD-10-CM

## 2018-12-30 NOTE — Telephone Encounter (Signed)
I called the patient yesterday and explained intraoperative findings in detail and the diagnosis of ureterovaginal fistula with inability to place a ureteral stent.  Dr. Matilde Sprang and myself felt percutaneous nephrostomy tube placement would be the next best step.  This procedure was discussed that an external tube would be placed in the kidney which would be temporary.  She was informed of the possibility of internalizing the tube to a ureteral stent and the possible need for additional surgery, i.e. laparoscopic ureteral reimplant.  I informed her that she would be contacted regarding nephrostomy tube placement.  She indicated at that time that she understood and had no further questions.

## 2018-12-30 NOTE — Telephone Encounter (Signed)
I have faxed the referral to [facility] [phone] option 1    Thanks, Sharyn Lull

## 2018-12-30 NOTE — Telephone Encounter (Signed)
Patient states there has been poor communication regarding her condition and would like to be referred to [facility]. She does not want to have a nephrostomy tube placed at this time.

## 2018-12-31 ENCOUNTER — Ambulatory Visit: Admission: RE | Admit: 2018-12-31 | Payer: 59 | Source: Ambulatory Visit

## 2019-01-05 ENCOUNTER — Encounter: Payer: Self-pay | Admitting: Urology

## 2019-01-07 ENCOUNTER — Ambulatory Visit: Payer: 59

## 2019-01-08 NOTE — Telephone Encounter (Signed)
FYI  I canceled her phone app for 01-11-19 because she no showed for her [place]

## 2019-01-11 ENCOUNTER — Telehealth: Payer: 59 | Admitting: Urology

## 2019-01-12 DIAGNOSIS — A419 Sepsis, unspecified organism: Secondary | ICD-10-CM | POA: Insufficient documentation

## 2019-01-12 DIAGNOSIS — N12 Tubulo-interstitial nephritis, not specified as acute or chronic: Secondary | ICD-10-CM | POA: Insufficient documentation

## 2019-01-14 DIAGNOSIS — N821 Other female urinary-genital tract fistulae: Secondary | ICD-10-CM | POA: Insufficient documentation

## 2019-02-23 ENCOUNTER — Other Ambulatory Visit: Payer: Self-pay

## 2019-02-24 ENCOUNTER — Inpatient Hospital Stay: Payer: 59 | Attending: Oncology

## 2019-04-12 ENCOUNTER — Ambulatory Visit
Admission: RE | Admit: 2019-04-12 | Discharge: 2019-04-12 | Disposition: A | Discharge status: Home / Self Care | Payer: 59 | Source: Ambulatory Visit | Attending: Obstetrics & Gynecology | Admitting: Obstetrics & Gynecology

## 2019-04-12 DIAGNOSIS — Z1231 Encounter for screening mammogram for malignant neoplasm of breast: Secondary | ICD-10-CM | POA: Diagnosis present

## 2019-05-25 ENCOUNTER — Inpatient Hospital Stay: Payer: 59

## 2019-05-25 ENCOUNTER — Other Ambulatory Visit: Payer: Self-pay

## 2019-05-25 DIAGNOSIS — D509 Iron deficiency anemia, unspecified: Secondary | ICD-10-CM | POA: Diagnosis not present

## 2019-05-25 LAB — CBC WITH DIFFERENTIAL/PLATELET
Abs Immature Granulocytes: 0.01 10*3/uL (ref 0.00–0.07)
Basophils Absolute: 0.1 10*3/uL (ref 0.0–0.1)
Basophils Relative: 1 %
Eosinophils Absolute: 0.2 10*3/uL (ref 0.0–0.5)
Eosinophils Relative: 4 %
HCT: 39.3 % (ref 36.0–46.0)
Hemoglobin: 13.2 g/dL (ref 12.0–15.0)
Immature Granulocytes: 0 %
Lymphocytes Relative: 45 %
Lymphs Abs: 2 10*3/uL (ref 0.7–4.0)
MCH: 29.9 pg (ref 26.0–34.0)
MCHC: 33.6 g/dL (ref 30.0–36.0)
MCV: 89.1 fL (ref 80.0–100.0)
Monocytes Absolute: 0.3 10*3/uL (ref 0.1–1.0)
Monocytes Relative: 7 %
Neutro Abs: 1.9 10*3/uL (ref 1.7–7.7)
Neutrophils Relative %: 43 %
Platelets: 304 10*3/uL (ref 150–400)
RBC: 4.41 MIL/uL (ref 3.87–5.11)
RDW: 12.7 % (ref 11.5–15.5)
WBC: 4.5 10*3/uL (ref 4.0–10.5)
nRBC: 0 % (ref 0.0–0.2)

## 2019-05-25 LAB — IRON AND TIBC
Iron: 78 ug/dL (ref 28–170)
Saturation Ratios: 20 % (ref 10.4–31.8)
TIBC: 385 ug/dL (ref 250–450)
UIBC: 307 ug/dL

## 2019-05-25 LAB — FERRITIN: Ferritin: 22 ng/mL (ref 11–307)

## 2019-05-26 ENCOUNTER — Encounter: Payer: Self-pay | Admitting: Oncology

## 2019-05-26 ENCOUNTER — Other Ambulatory Visit: Payer: Self-pay

## 2019-05-26 NOTE — Progress Notes (Signed)
Patient stated that she is currently feeling well with her anemia. However, she has had a runny eye that OTC Zyrtec taking care of the problem. Patient stated that she will contact her PCP and let her know about it.

## 2019-05-27 ENCOUNTER — Inpatient Hospital Stay: Payer: 59 | Attending: Oncology | Admitting: Oncology

## 2019-05-27 ENCOUNTER — Inpatient Hospital Stay: Payer: 59

## 2019-05-27 DIAGNOSIS — D509 Iron deficiency anemia, unspecified: Secondary | ICD-10-CM | POA: Diagnosis not present

## 2019-05-28 NOTE — Progress Notes (Signed)
I connected with Tracey Landry on 05/28/19 at 10:45 AM EST by video enabled telemedicine visit and verified that I am speaking with the correct person using two identifiers.   I discussed the limitations, risks, security and privacy concerns of performing an evaluation and management service by telemedicine and the availability of in-person appointments. I also discussed with the patient that there may be a patient responsible charge related to this service. The patient expressed understanding and agreed to proceed.  Other persons participating in the visit and their role in the encounter:  none  Patient's location:  home Provider's location:  work  Risk analyst Complaint: Routine follow-up of iron deficiency anemia  History of present illness: Patient is a 49 year old female who sees [name] for pelvic pain and fibroids. She does have menorrhagia secondary to fibroids and will be undergoing surgery for fibroids in 2 to 3 weeks time. She has been referred to [place] for iron deficiency anemia. Most recent CBC from 09/25/2018.Showed white count of 4.9, H&H of 9.9/32.1 with an MCV of 76.4 iron study showed a low ferritin of 4 and increased TIBC of 498. CMP was within normal limits.  Patient reports iron deficiency anemia for most of her life. She has been taking oral iron tablets which have not been helping her and it also causes abdominal discomfort and therefore she stopped taking it. She does have significant fibroids and will be undergoing hysterectomy in 3 weeks. However she states that these fibroids mainly cause pain and pelvic discomfort but they have not caused menorrhagia. Her cycles are regular and they last for about 3 to 4 days. Denies any family history of colon cancer. Denies any unintentional weight loss. Denies any blood in stool or urine. Denies any dark melanotic stool. Denies any consistent use of NSAIDs.  Patient underwent hysterectomy for her fibroids complicated by ureteral  injury that was fixed  Interval history. She feels well. Denies any complaints. Denies any blood in stool or urine   Review of Systems  Constitutional: Negative for chills, fever, malaise/fatigue and weight loss.  HENT: Negative for congestion, ear discharge and nosebleeds.   Eyes: Negative for blurred vision.  Respiratory: Negative for cough, hemoptysis, sputum production, shortness of breath and wheezing.   Cardiovascular: Negative for chest pain, palpitations, orthopnea and claudication.  Gastrointestinal: Negative for abdominal pain, blood in stool, constipation, diarrhea, heartburn, melena, nausea and vomiting.  Genitourinary: Negative for dysuria, flank pain, frequency, hematuria and urgency.  Musculoskeletal: Negative for back pain, joint pain and myalgias.  Skin: Negative for rash.  Neurological: Negative for dizziness, tingling, focal weakness, seizures, weakness and headaches.  Endo/Heme/Allergies: Does not bruise/bleed easily.  Psychiatric/Behavioral: Negative for depression and suicidal ideas. The patient does not have insomnia.     No Known Allergies  Past Medical History:  Diagnosis Date  . Anemia    IRON INFUSIONS  . Leiomyoma     Past Surgical History:  Procedure Laterality Date  . ABDOMINAL HYSTERECTOMY    . CYSTOGRAM  12/28/2018   Procedure: CYSTOGRAM;  Surgeon: Abbie Sons, MD;  Location: [facility];  Service: Urology;;  . [name] W/ RETROGRADES Right 11/25/2018   Procedure: CYSTOSCOPY WITH RETROGRADE PYELOGRAM;  Surgeon: Abbie Sons, MD;  Location: [facility];  Service: Urology;  Laterality: Right;  . CYSTOSCOPY W/ RETROGRADES Right 12/28/2018   Procedure: CYSTOSCOPY WITH RETROGRADE PYELOGRAM;  Surgeon: Abbie Sons, MD;  Location: [facility];  Service: Urology;  Laterality: Right;  . [place]  10/19/2018   Procedure: CYSTOSCOPY WITH RETROGRADE PYELOGRAM RIGHT STENT PLACEMENT;  Surgeon: Abbie Sons, MD;  Location:  [facility];  Service: Urology;;  . [name] W/ URETERAL STENT REMOVAL Right 11/25/2018   Procedure: CYSTOSCOPY WITH STENT REMOVAL;  Surgeon: Abbie Sons, MD;  Location: [facility];  Service: Urology;  Laterality: Right;  . CYSTOSCOPY WITH STENT PLACEMENT Right 12/28/2018   Procedure: CYSTOSCOPY WITH  EXAM UNDER ANESTHESIA, FAILED STENT;  Surgeon: Abbie Sons, MD;  Location: [facility];  Service: Urology;  Laterality: Right;  . FOOT SURGERY    . ROBOTIC ASSISTED LAPAROSCOPIC LYSIS OF ADHESION  10/19/2018   Procedure: XI ROBOTIC ASSISTED LAPAROSCOPIC LYSIS OF ADHESION & RIGHT URETEROLYSIS;  Surgeon: Ward, Honor Loh, MD;  Location: [facility];  Service: Gynecology;;  . ROBOTIC ASSISTED TOTAL HYSTERECTOMY WITH SALPINGECTOMY Bilateral 10/19/2018   Procedure: ROBOTIC ASSISTED LAPAROSCOPIC  HYSTERECTOMY WITH BILATERAL SALPINGECTOMY,with hand morcellation;  Surgeon: Ward, Honor Loh, MD;  Location: [facility];  Service: Gynecology;  Laterality: Bilateral;  . TUBAL LIGATION      Social History   Socioeconomic History  . Marital status: Single    Spouse name: Not on file  . Number of children: Not on file  . Years of education: Not on file  . Highest education level: Not on file  Occupational History  . Not on file  Tobacco Use  . Smoking status: Never Smoker  . Smokeless tobacco: Never Used  Substance and Sexual Activity  . Alcohol use: Never  . Drug use: Never  . Sexual activity: Yes  Other Topics Concern  . Not on file  Social History Narrative  . Not on file   Social Determinants of Health   Financial Resource Strain:   . Difficulty of Paying Living Expenses: Not on file  Food Insecurity:   . Worried About Charity fundraiser in the Last Year: Not on file  . Ran Out of Food in the Last Year: Not on file  Transportation Needs:   . Lack of Transportation (Medical): Not on file  . Lack of Transportation (Non-Medical): Not on file  Physical Activity:   . Days of Exercise per Week:  Not on file  . Minutes of Exercise per Session: Not on file  Stress:   . Feeling of Stress : Not on file  Social Connections:   . Frequency of Communication with Friends and Family: Not on file  . Frequency of Social Gatherings with Friends and Family: Not on file  . Attends Religious Services: Not on file  . Active Member of Clubs or Organizations: Not on file  . Attends Archivist Meetings: Not on file  . Marital Status: Not on file  Intimate Partner Violence:   . Fear of Current or Ex-Partner: Not on file  . Emotionally Abused: Not on file  . Physically Abused: Not on file  . Sexually Abused: Not on file    Family History  Problem Relation Age of Onset  . Hypertension Mother   . Hypertension Brother   . Breast cancer Neg Hx        CMP Latest Ref Rng & Units 10/15/2018  Glucose 70 - 99 mg/dL 100(H)  BUN 6 - 20 mg/dL 12  Creatinine 0.44 - 1.00 mg/dL 0.57  Sodium 135 - 145 mmol/L 137  Potassium 3.5 - 5.1 mmol/L 4.3  Chloride 98 - 111 mmol/L 105  CO2 22 - 32 mmol/L 24  Calcium 8.9 - 10.3 mg/dL 9.6   CBC  Latest Ref Rng & Units 05/25/2019  WBC 4.0 - 10.5 K/uL 4.5  Hemoglobin 12.0 - 15.0 g/dL 13.2  Hematocrit 36.0 - 46.0 % 39.3  Platelets 150 - 400 K/uL 304     Observation/objective: Appears in no acute distress on video visit today.  Breathing is nonlabored  Assessment and plan: Patient is a 49 year old female with iron deficiency anemia Likely secondary to menorrhagia s/p hysterectomy here for routine follow-up  After receiving IV iron patient's hemoglobin is improved from 11.6 13.2.  However she still has some iron deficiency given that her ferritin was 22.  I will hold off on giving her more IV iron at this time and I have asked her to take oral iron every other day if she can tolerate it.  I suspect that her iron deficiency was from her menorrhagia which is now resolved after hysterectomy.  Her iron levels are likely to improve in due course of time.  Check  CBC ferritin and iron studies in 3 in 6 months and I will see her back in 6 months  Follow-up instructions: As above  I discussed the assessment and treatment plan with the patient. The patient was provided an opportunity to ask questions and all were answered. The patient agreed with the plan and demonstrated an understanding of the instructions.   The patient was advised to call back or seek an in-person evaluation if the symptoms worsen or if the condition fails to improve as anticipated.   Visit Diagnosis: 1. Iron deficiency anemia, unspecified iron deficiency anemia type     Dr. Randa Evens, MD, MPH [facility] at [facility] Tel- [phone] 05/28/2019 9:32 AM

## 2019-08-24 ENCOUNTER — Inpatient Hospital Stay: Payer: 59 | Attending: Oncology

## 2019-11-24 ENCOUNTER — Inpatient Hospital Stay: Payer: 59

## 2019-11-24 ENCOUNTER — Inpatient Hospital Stay: Payer: 59 | Attending: Oncology

## 2019-11-24 ENCOUNTER — Other Ambulatory Visit: Payer: Self-pay

## 2019-11-24 DIAGNOSIS — D500 Iron deficiency anemia secondary to blood loss (chronic): Secondary | ICD-10-CM | POA: Diagnosis present

## 2019-11-24 DIAGNOSIS — N920 Excessive and frequent menstruation with regular cycle: Secondary | ICD-10-CM | POA: Insufficient documentation

## 2019-11-24 DIAGNOSIS — D509 Iron deficiency anemia, unspecified: Secondary | ICD-10-CM

## 2019-11-24 LAB — CBC WITH DIFFERENTIAL/PLATELET
Abs Immature Granulocytes: 0.01 10*3/uL (ref 0.00–0.07)
Basophils Absolute: 0 10*3/uL (ref 0.0–0.1)
Basophils Relative: 1 %
Eosinophils Absolute: 0.2 10*3/uL (ref 0.0–0.5)
Eosinophils Relative: 3 %
HCT: 36.3 % (ref 36.0–46.0)
Hemoglobin: 12.8 g/dL (ref 12.0–15.0)
Immature Granulocytes: 0 %
Lymphocytes Relative: 38 %
Lymphs Abs: 2.1 10*3/uL (ref 0.7–4.0)
MCH: 31.1 pg (ref 26.0–34.0)
MCHC: 35.3 g/dL (ref 30.0–36.0)
MCV: 88.3 fL (ref 80.0–100.0)
Monocytes Absolute: 0.3 10*3/uL (ref 0.1–1.0)
Monocytes Relative: 6 %
Neutro Abs: 2.9 10*3/uL (ref 1.7–7.7)
Neutrophils Relative %: 52 %
Platelets: 273 10*3/uL (ref 150–400)
RBC: 4.11 MIL/uL (ref 3.87–5.11)
RDW: 13.2 % (ref 11.5–15.5)
WBC: 5.5 10*3/uL (ref 4.0–10.5)
nRBC: 0 % (ref 0.0–0.2)

## 2019-11-24 LAB — IRON AND TIBC
Iron: 68 ug/dL (ref 28–170)
Saturation Ratios: 20 % (ref 10.4–31.8)
TIBC: 339 ug/dL (ref 250–450)
UIBC: 271 ug/dL

## 2019-11-24 LAB — FERRITIN: Ferritin: 40 ng/mL (ref 11–307)

## 2019-11-25 ENCOUNTER — Inpatient Hospital Stay (HOSPITAL_BASED_OUTPATIENT_CLINIC_OR_DEPARTMENT_OTHER): Payer: 59 | Admitting: Oncology

## 2019-11-25 DIAGNOSIS — D509 Iron deficiency anemia, unspecified: Secondary | ICD-10-CM

## 2019-11-28 NOTE — Progress Notes (Signed)
I connected with Tracey Landry on 11/28/19 at  2:00 PM EDT by video enabled telemedicine visit and verified that I am speaking with the correct person using two identifiers.   I discussed the limitations, risks, security and privacy concerns of performing an evaluation and management service by telemedicine and the availability of in-person appointments. I also discussed with the patient that there may be a patient responsible charge related to this service. The patient expressed understanding and agreed to proceed.  There were problems with video connection and we had to switch to telephone call   Other persons participating in the visit and their role in the encounter:  none  Patient's location:  home Provider's location:  work  Risk analyst Complaint:  Routine f/u of iron deficiency anemia  History of present illness:  Patient is a 49 year old female who sees Dr. Leonides Landry for pelvic pain and fibroids. She does have menorrhagia secondary to fibroids and will be undergoing surgery for fibroids in 2 to 3 weeks time. She has been referred to [place] for iron deficiency anemia. Most recent CBC from 09/25/2018.Showed white count of 4.9, H&H of 9.9/32.1 with an MCV of 76.4 iron study showed a low ferritin of 4 and increased TIBC of 498. CMP was within normal limits.  Patient reports iron deficiency anemia for most of her life. She has been taking oral iron tablets which have not been helping her and it also causes abdominal discomfort and therefore she stopped taking it. She does have significant fibroids and will be undergoing hysterectomy in 3 weeks. However she states that these fibroids mainly cause pain and pelvic discomfort but they have not caused menorrhagia. Her cycles are regular and they last for about 3 to 4 days. Denies any family history of colon cancer. Denies any unintentional weight loss. Denies any blood in stool or urine. Denies any dark melanotic stool. Denies any consistent use of  NSAIDs.  Patient underwent hysterectomy for her fibroids complicated by ureteral injury that was fixed  Interval history: Patient reports doing well. Energy levels are good. Denies any blood in stool or urine   Review of Systems  Constitutional: Negative for chills, fever, malaise/fatigue and weight loss.  HENT: Negative for congestion, ear discharge and nosebleeds.   Eyes: Negative for blurred vision.  Respiratory: Negative for cough, hemoptysis, sputum production, shortness of breath and wheezing.   Cardiovascular: Negative for chest pain, palpitations, orthopnea and claudication.  Gastrointestinal: Negative for abdominal pain, blood in stool, constipation, diarrhea, heartburn, melena, nausea and vomiting.  Genitourinary: Negative for dysuria, flank pain, frequency, hematuria and urgency.  Musculoskeletal: Negative for back pain, joint pain and myalgias.  Skin: Negative for rash.  Neurological: Negative for dizziness, tingling, focal weakness, seizures, weakness and headaches.  Endo/Heme/Allergies: Does not bruise/bleed easily.  Psychiatric/Behavioral: Negative for depression and suicidal ideas. The patient does not have insomnia.     No Known Allergies  Past Medical History:  Diagnosis Date  . Anemia    IRON INFUSIONS  . Leiomyoma     Past Surgical History:  Procedure Laterality Date  . ABDOMINAL HYSTERECTOMY    . CYSTOGRAM  12/28/2018   Procedure: CYSTOGRAM;  Surgeon: Abbie Sons, MD;  Location: [facility];  Service: Urology;;  . [name] W/ RETROGRADES Right 11/25/2018   Procedure: CYSTOSCOPY WITH RETROGRADE PYELOGRAM;  Surgeon: Abbie Sons, MD;  Location: [facility];  Service: Urology;  Laterality: Right;  . CYSTOSCOPY W/ RETROGRADES Right 12/28/2018   Procedure: CYSTOSCOPY WITH RETROGRADE PYELOGRAM;  Surgeon: Bernardo Heater,  Ronda Fairly, MD;  Location: [facility];  Service: Urology;  Laterality: Right;  . CYSTOSCOPY W/ URETERAL STENT PLACEMENT  10/19/2018   Procedure:  CYSTOSCOPY WITH RETROGRADE PYELOGRAM RIGHT STENT PLACEMENT;  Surgeon: Abbie Sons, MD;  Location: [facility];  Service: Urology;;  . [name] W/ URETERAL STENT REMOVAL Right 11/25/2018   Procedure: CYSTOSCOPY WITH STENT REMOVAL;  Surgeon: Abbie Sons, MD;  Location: [facility];  Service: Urology;  Laterality: Right;  . CYSTOSCOPY WITH STENT PLACEMENT Right 12/28/2018   Procedure: CYSTOSCOPY WITH  EXAM UNDER ANESTHESIA, FAILED STENT;  Surgeon: Abbie Sons, MD;  Location: [facility];  Service: Urology;  Laterality: Right;  . FOOT SURGERY    . ROBOTIC ASSISTED LAPAROSCOPIC LYSIS OF ADHESION  10/19/2018   Procedure: XI ROBOTIC ASSISTED LAPAROSCOPIC LYSIS OF ADHESION & RIGHT URETEROLYSIS;  Surgeon: Ward, Honor Loh, MD;  Location: [facility];  Service: Gynecology;;  . ROBOTIC ASSISTED TOTAL HYSTERECTOMY WITH SALPINGECTOMY Bilateral 10/19/2018   Procedure: ROBOTIC ASSISTED LAPAROSCOPIC  HYSTERECTOMY WITH BILATERAL SALPINGECTOMY,with hand morcellation;  Surgeon: Ward, Honor Loh, MD;  Location: [facility];  Service: Gynecology;  Laterality: Bilateral;  . TUBAL LIGATION      Social History   Socioeconomic History  . Marital status: Single    Spouse name: Not on file  . Number of children: Not on file  . Years of education: Not on file  . Highest education level: Not on file  Occupational History  . Not on file  Tobacco Use  . Smoking status: Never Smoker  . Smokeless tobacco: Never Used  Vaping Use  . Vaping Use: Never used  Substance and Sexual Activity  . Alcohol use: Never  . Drug use: Never  . Sexual activity: Yes  Other Topics Concern  . Not on file  Social History Narrative  . Not on file   Social Determinants of Health   Financial Resource Strain:   . Difficulty of Paying Living Expenses: Not on file  Food Insecurity:   . Worried About Charity fundraiser in the Last Year: Not on file  . Ran Out of Food in the Last Year: Not on file  Transportation Needs:   . Lack of  Transportation (Medical): Not on file  . Lack of Transportation (Non-Medical): Not on file  Physical Activity:   . Days of Exercise per Week: Not on file  . Minutes of Exercise per Session: Not on file  Stress:   . Feeling of Stress : Not on file  Social Connections:   . Frequency of Communication with Friends and Family: Not on file  . Frequency of Social Gatherings with Friends and Family: Not on file  . Attends Religious Services: Not on file  . Active Member of Clubs or Organizations: Not on file  . Attends Archivist Meetings: Not on file  . Marital Status: Not on file  Intimate Partner Violence:   . Fear of Current or Ex-Partner: Not on file  . Emotionally Abused: Not on file  . Physically Abused: Not on file  . Sexually Abused: Not on file    Family History  Problem Relation Age of Onset  . Hypertension Mother   . Hypertension Brother   . Breast cancer Neg Hx        CMP Latest Ref Rng & Units 10/15/2018  Glucose 70 - 99 mg/dL 100(H)  BUN 6 - 20 mg/dL 12  Creatinine 0.44 - 1.00 mg/dL 0.57  Sodium 135 - 145 mmol/L 137  Potassium 3.5 - 5.1 mmol/L 4.3  Chloride 98 - 111 mmol/L 105  CO2 22 - 32 mmol/L 24  Calcium 8.9 - 10.3 mg/dL 9.6   CBC Latest Ref Rng & Units 11/24/2019  WBC 4.0 - 10.5 K/uL 5.5  Hemoglobin 12.0 - 15.0 g/dL 12.8  Hematocrit 36 - 46 % 36.3  Platelets 150 - 400 K/uL 273    Assessment and plan:Patient is a 5 year olf female with iron deficiency anemia and this is a routine f/u visit  Patient is not currently anemic. Iron studies are normal. Last received IV iron in July 2020. Has undergone hysterectomy since then. She does not require hematology follow up at this time. She can f/u with her pcp. Also encouraged her to consider undergoing screening colonoscopy and discuss with pcp further   Follow-up instructions:  I discussed the assessment and treatment plan with the patient. The patient was provided an opportunity to ask questions and  all were answered. The patient agreed with the plan and demonstrated an understanding of the instructions.   The patient was advised to call back or seek an in-person evaluation if the symptoms worsen or if the condition fails to improve as anticipated.    Visit Diagnosis: 1. Iron deficiency anemia, unspecified iron deficiency anemia type     Dr. Randa Evens, MD, MPH [facility] at [facility] Tel- [phone] 11/28/2019 10:01 AM

## 2020-04-18 ENCOUNTER — Other Ambulatory Visit: Payer: 59

## 2020-04-19 ENCOUNTER — Other Ambulatory Visit: Payer: Self-pay | Admitting: Family Medicine

## 2020-04-19 DIAGNOSIS — Z1231 Encounter for screening mammogram for malignant neoplasm of breast: Secondary | ICD-10-CM

## 2020-05-10 ENCOUNTER — Other Ambulatory Visit: Payer: Self-pay

## 2020-05-10 ENCOUNTER — Ambulatory Visit
Admission: RE | Admit: 2020-05-10 | Discharge: 2020-05-10 | Disposition: A | Discharge status: Home / Self Care | Payer: PRIVATE HEALTH INSURANCE | Source: Ambulatory Visit | Attending: Family Medicine | Admitting: Family Medicine

## 2020-05-10 DIAGNOSIS — Z1231 Encounter for screening mammogram for malignant neoplasm of breast: Secondary | ICD-10-CM | POA: Diagnosis present

## 2021-05-01 ENCOUNTER — Other Ambulatory Visit: Payer: Self-pay | Admitting: Family Medicine

## 2021-05-08 ENCOUNTER — Other Ambulatory Visit: Payer: Self-pay | Admitting: Family Medicine

## 2021-05-08 DIAGNOSIS — Z1231 Encounter for screening mammogram for malignant neoplasm of breast: Secondary | ICD-10-CM

## 2021-06-15 ENCOUNTER — Ambulatory Visit
Admission: RE | Admit: 2021-06-15 | Discharge: 2021-06-15 | Disposition: A | Discharge status: Home / Self Care | Payer: PRIVATE HEALTH INSURANCE | Source: Ambulatory Visit | Attending: Family Medicine | Admitting: Family Medicine

## 2021-06-15 ENCOUNTER — Other Ambulatory Visit: Payer: Self-pay

## 2021-06-15 DIAGNOSIS — Z1231 Encounter for screening mammogram for malignant neoplasm of breast: Secondary | ICD-10-CM

## 2022-03-15 IMAGING — MG MM DIGITAL SCREENING BILAT W/ TOMO AND CAD
6 of 10 series · 6 of 30 positions shown · non-contrast
Comparison: Previous exam(s).

CLINICAL DATA: Screening.

EXAM:
DIGITAL SCREENING BILATERAL MAMMOGRAM WITH TOMOSYNTHESIS AND CAD
TECHNIQUE: Bilateral screening digital craniocaudal and mediolateral oblique
mammograms were obtained. Bilateral screening digital breast
tomosynthesis was performed. The images were evaluated with
computer-aided detection.

[R MLO synth-2D (1 of 2)]
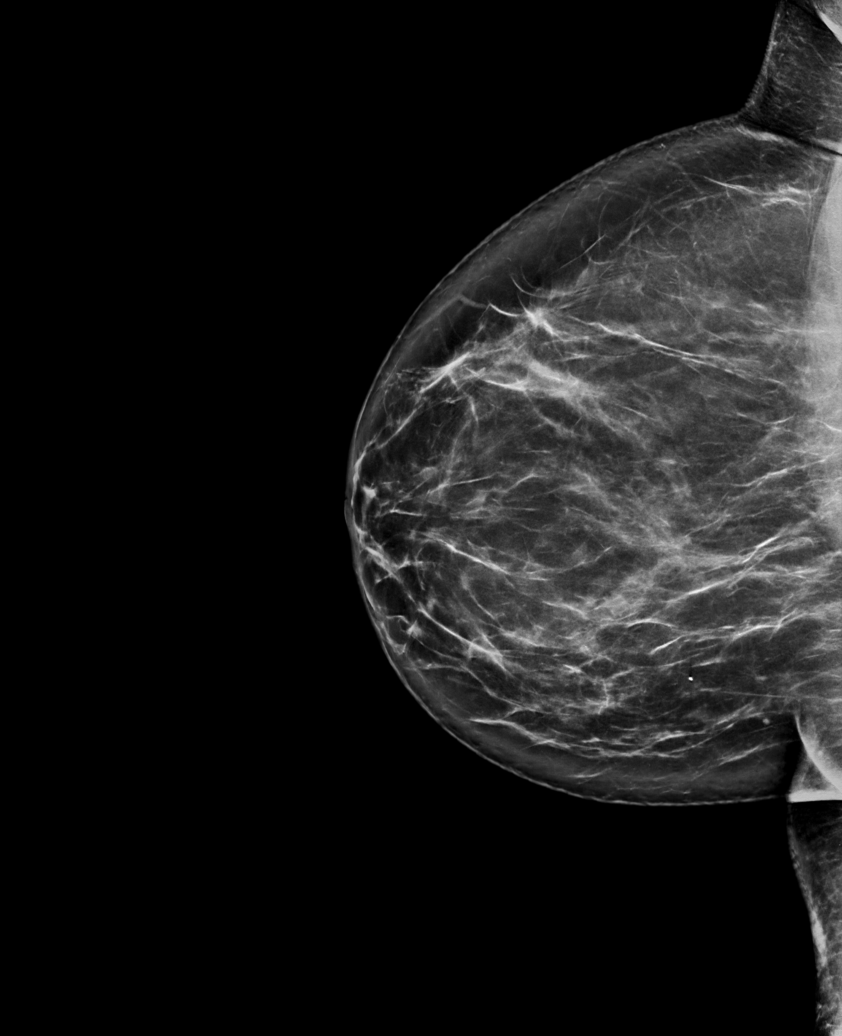

[R CC synth-2D]
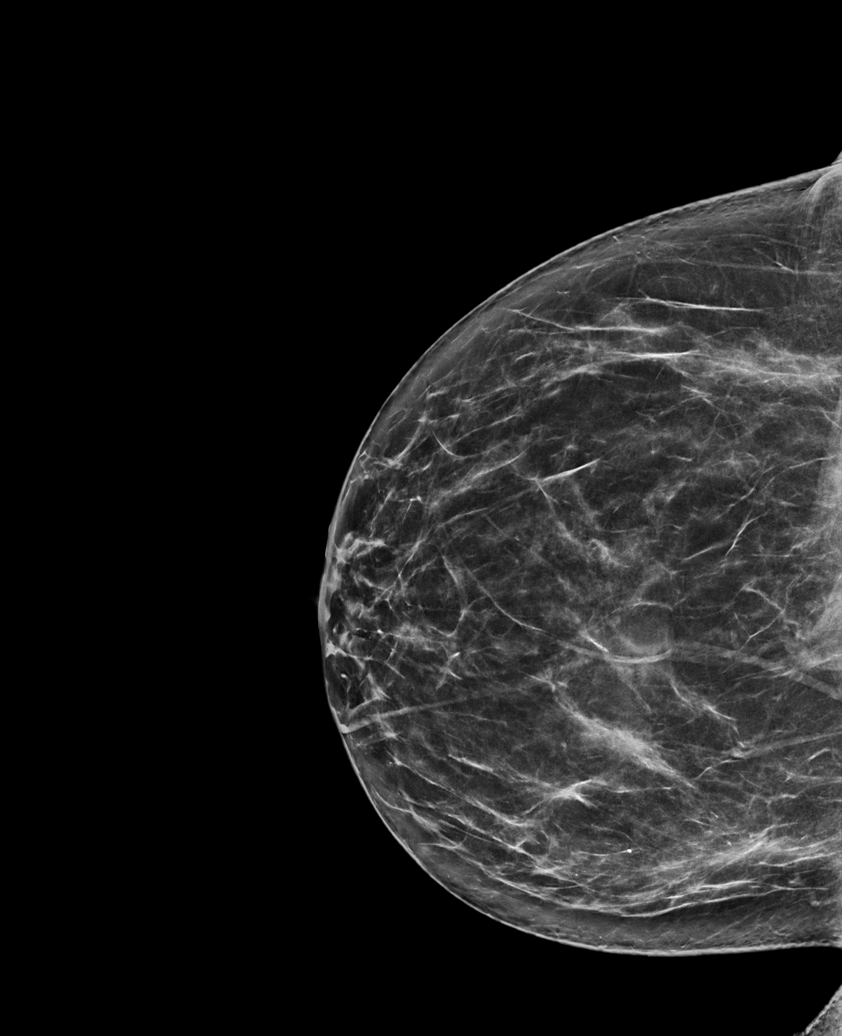

[R MLO synth-2D (2 of 2)]
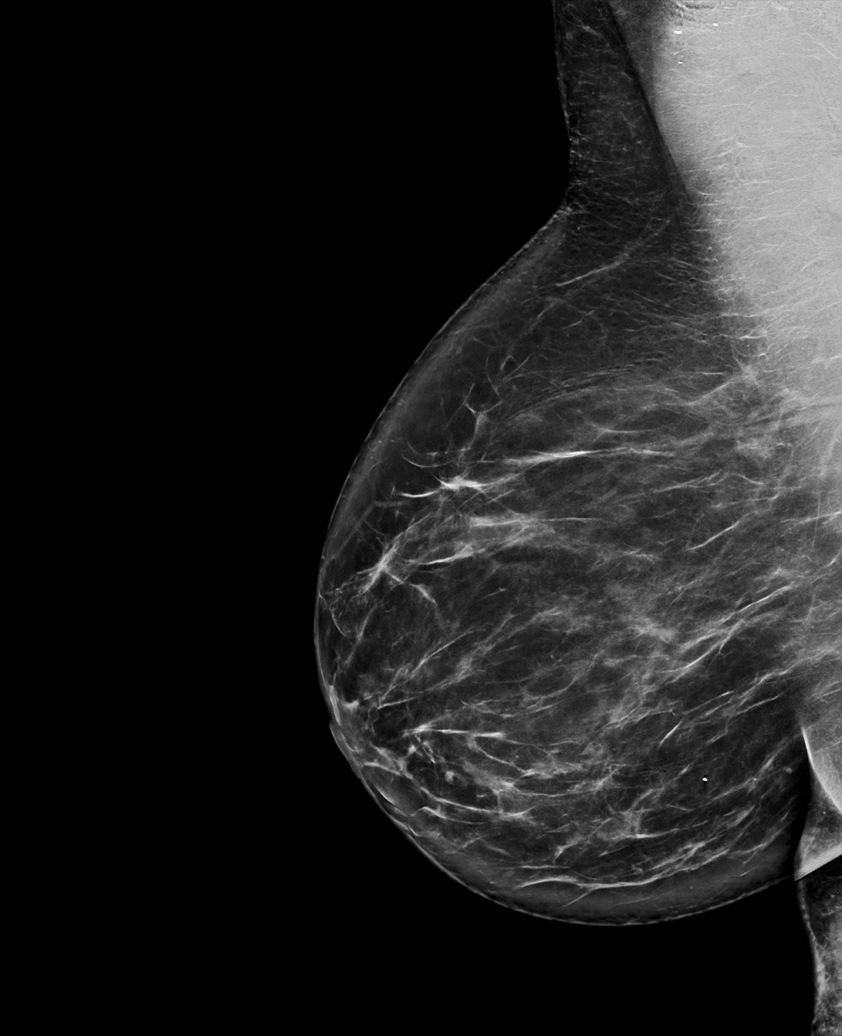

[L MLO synth-2D]
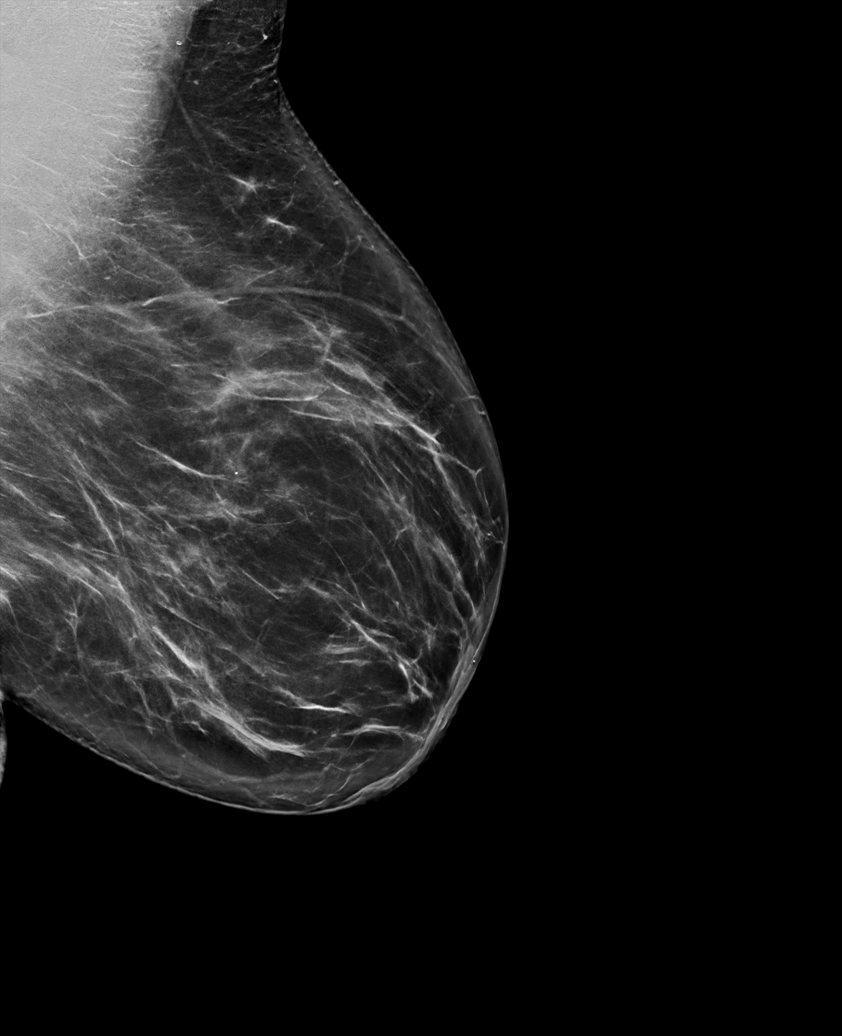

[L CC synth-2D]
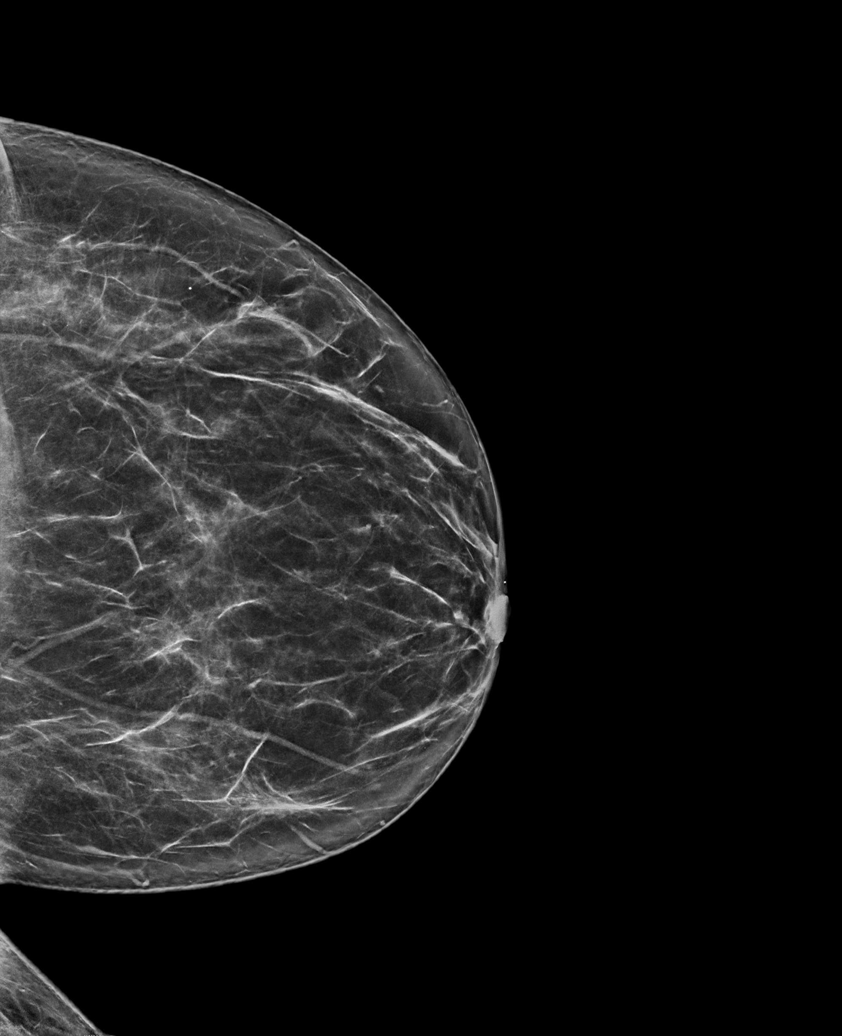

[R MLO tomo · tomo slice 48/95.0]
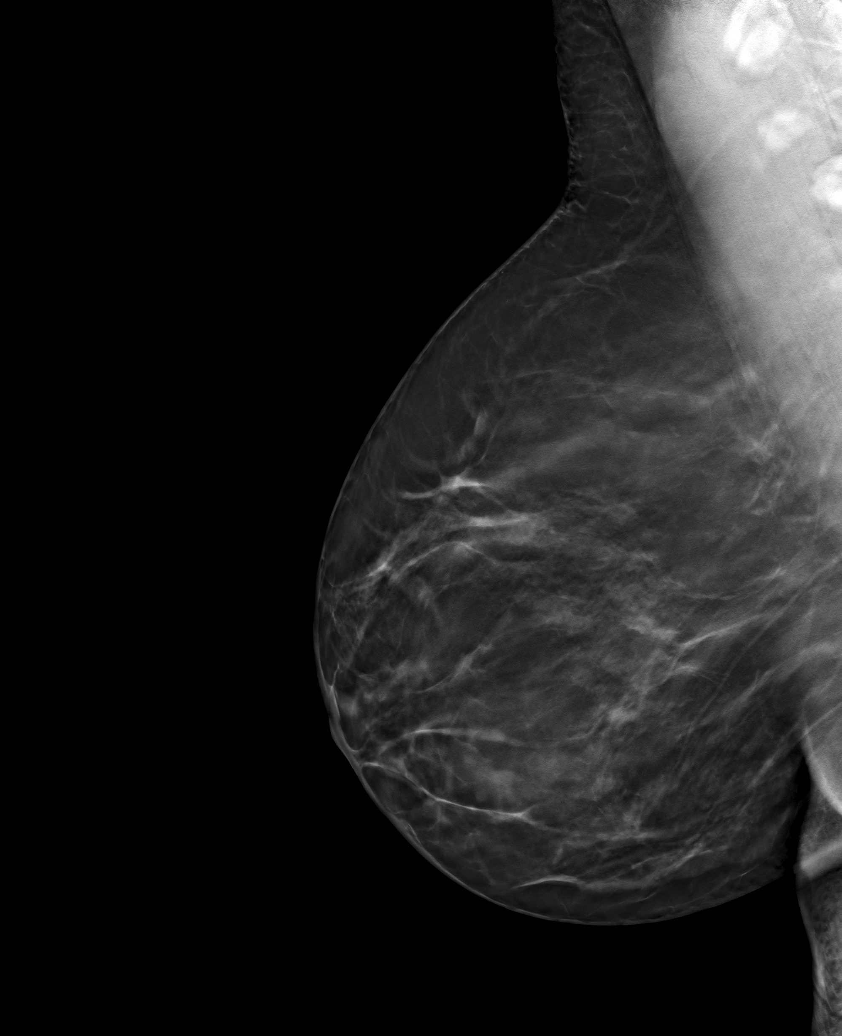

[6 of 30 positions shown; findings below may reference images not displayed]

ACR Breast Density Category b: There are scattered areas of
fibroglandular density.
FINDINGS: There are no findings suspicious for malignancy.
IMPRESSION: No mammographic evidence of malignancy. A result letter of this
screening mammogram will be mailed directly to the patient.

RECOMMENDATION:
Screening mammogram in one year. (Code:51-O-LD2)

BI-RADS CATEGORY  1: Negative.

## 2022-06-06 ENCOUNTER — Other Ambulatory Visit: Payer: Self-pay

## 2022-06-06 DIAGNOSIS — Z1231 Encounter for screening mammogram for malignant neoplasm of breast: Secondary | ICD-10-CM

## 2022-06-25 ENCOUNTER — Ambulatory Visit
Admission: RE | Admit: 2022-06-25 | Discharge: 2022-06-25 | Disposition: A | Discharge status: Home / Self Care | Payer: PRIVATE HEALTH INSURANCE | Source: Ambulatory Visit | Attending: Physician Assistant | Admitting: Physician Assistant

## 2022-06-25 DIAGNOSIS — Z1231 Encounter for screening mammogram for malignant neoplasm of breast: Secondary | ICD-10-CM | POA: Insufficient documentation

## 2023-08-07 ENCOUNTER — Encounter: Payer: Self-pay | Admitting: Oncology

## 2023-08-07 ENCOUNTER — Other Ambulatory Visit: Payer: Self-pay | Admitting: Family Medicine

## 2023-08-07 DIAGNOSIS — Z1231 Encounter for screening mammogram for malignant neoplasm of breast: Secondary | ICD-10-CM

## 2023-08-08 ENCOUNTER — Encounter: Payer: Self-pay | Admitting: Oncology

## 2023-08-08 ENCOUNTER — Ambulatory Visit
Admission: RE | Admit: 2023-08-08 | Discharge: 2023-08-08 | Disposition: A | Discharge status: Home / Self Care | Payer: PRIVATE HEALTH INSURANCE | Source: Ambulatory Visit | Attending: Family Medicine | Admitting: Family Medicine

## 2023-08-08 DIAGNOSIS — Z1231 Encounter for screening mammogram for malignant neoplasm of breast: Secondary | ICD-10-CM | POA: Insufficient documentation
